# Patient Record
Sex: Female | Born: 1955 | Race: White | Hispanic: No | Marital: Single | Smoking: Former smoker
Health system: Southern US, Community
[De-identification: ages and names within clinical notes are randomized; demographics above are authoritative.]

## PROBLEM LIST (undated history)

## (undated) DIAGNOSIS — N6099 Unspecified benign mammary dysplasia of unspecified breast: Secondary | ICD-10-CM

## (undated) DIAGNOSIS — C7931 Secondary malignant neoplasm of brain: Secondary | ICD-10-CM

## (undated) DIAGNOSIS — R768 Other specified abnormal immunological findings in serum: Secondary | ICD-10-CM

## (undated) DIAGNOSIS — E7800 Pure hypercholesterolemia, unspecified: Secondary | ICD-10-CM

## (undated) DIAGNOSIS — C3490 Malignant neoplasm of unspecified part of unspecified bronchus or lung: Secondary | ICD-10-CM

## (undated) DIAGNOSIS — I10 Essential (primary) hypertension: Secondary | ICD-10-CM

## (undated) HISTORY — DX: Malignant neoplasm of unspecified part of unspecified bronchus or lung: C34.90

## (undated) HISTORY — DX: Secondary malignant neoplasm of brain: C79.31

## (undated) HISTORY — DX: Unspecified benign mammary dysplasia of unspecified breast: N60.99

## (undated) HISTORY — PX: ABDOMINAL HYSTERECTOMY: SHX81

## (undated) HISTORY — PX: BREAST SURGERY: SHX581

## (undated) HISTORY — DX: Other specified abnormal immunological findings in serum: R76.8

## (undated) HISTORY — DX: Essential (primary) hypertension: I10

## (undated) HISTORY — DX: Pure hypercholesterolemia, unspecified: E78.00

---
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
## 1996-01-20 HISTORY — PX: BUNIONECTOMY: SHX129

## 2001-01-19 HISTORY — PX: ANTERIOR CRUCIATE LIGAMENT REPAIR: SHX115

## 2002-01-19 HISTORY — PX: KNEE DEBRIDEMENT: SHX1894

## 2003-08-09 ENCOUNTER — Other Ambulatory Visit: Payer: Self-pay

## 2004-10-03 ENCOUNTER — Ambulatory Visit: Payer: Self-pay | Admitting: Internal Medicine

## 2004-10-29 ENCOUNTER — Other Ambulatory Visit: Payer: Self-pay

## 2004-11-03 ENCOUNTER — Ambulatory Visit: Payer: Self-pay | Admitting: General Surgery

## 2004-11-17 ENCOUNTER — Ambulatory Visit: Payer: Self-pay | Admitting: General Surgery

## 2005-07-08 ENCOUNTER — Ambulatory Visit: Payer: Self-pay | Admitting: General Surgery

## 2006-01-04 ENCOUNTER — Ambulatory Visit: Payer: Self-pay | Admitting: General Surgery

## 2006-01-21 ENCOUNTER — Ambulatory Visit: Payer: Self-pay | Admitting: General Surgery

## 2007-01-10 ENCOUNTER — Ambulatory Visit: Payer: Self-pay | Admitting: Internal Medicine

## 2007-01-12 ENCOUNTER — Ambulatory Visit: Payer: Self-pay | Admitting: Internal Medicine

## 2007-02-02 ENCOUNTER — Ambulatory Visit: Payer: Self-pay | Admitting: General Surgery

## 2007-02-09 ENCOUNTER — Other Ambulatory Visit: Payer: Self-pay

## 2007-02-09 ENCOUNTER — Ambulatory Visit: Payer: Self-pay | Admitting: General Surgery

## 2007-02-14 ENCOUNTER — Ambulatory Visit: Payer: Self-pay | Admitting: General Surgery

## 2007-02-20 ENCOUNTER — Ambulatory Visit: Payer: Self-pay | Admitting: Oncology

## 2007-02-21 ENCOUNTER — Ambulatory Visit: Payer: Self-pay | Admitting: Oncology

## 2007-03-20 ENCOUNTER — Ambulatory Visit: Payer: Self-pay | Admitting: Oncology

## 2007-04-20 ENCOUNTER — Ambulatory Visit: Payer: Self-pay | Admitting: Oncology

## 2007-06-20 ENCOUNTER — Ambulatory Visit: Payer: Self-pay | Admitting: Internal Medicine

## 2007-07-06 ENCOUNTER — Ambulatory Visit: Payer: Self-pay | Admitting: Oncology

## 2007-07-20 ENCOUNTER — Ambulatory Visit: Payer: Self-pay | Admitting: Internal Medicine

## 2007-07-20 ENCOUNTER — Ambulatory Visit: Payer: Self-pay | Admitting: Oncology

## 2007-09-20 ENCOUNTER — Ambulatory Visit: Payer: Self-pay | Admitting: Internal Medicine

## 2007-10-06 ENCOUNTER — Ambulatory Visit: Payer: Self-pay | Admitting: Internal Medicine

## 2007-10-20 ENCOUNTER — Ambulatory Visit: Payer: Self-pay | Admitting: Internal Medicine

## 2007-12-20 ENCOUNTER — Ambulatory Visit: Payer: Self-pay | Admitting: Internal Medicine

## 2008-01-04 ENCOUNTER — Ambulatory Visit: Payer: Self-pay | Admitting: Internal Medicine

## 2008-01-20 ENCOUNTER — Ambulatory Visit: Payer: Self-pay | Admitting: Internal Medicine

## 2008-06-19 ENCOUNTER — Ambulatory Visit: Payer: Self-pay | Admitting: Internal Medicine

## 2008-07-04 ENCOUNTER — Ambulatory Visit: Payer: Self-pay | Admitting: Internal Medicine

## 2008-07-19 ENCOUNTER — Ambulatory Visit: Payer: Self-pay | Admitting: Internal Medicine

## 2008-12-19 ENCOUNTER — Ambulatory Visit: Payer: Self-pay | Admitting: Internal Medicine

## 2008-12-21 ENCOUNTER — Ambulatory Visit: Payer: Self-pay | Admitting: General Practice

## 2009-01-02 ENCOUNTER — Ambulatory Visit: Payer: Self-pay | Admitting: Internal Medicine

## 2009-01-19 ENCOUNTER — Ambulatory Visit: Payer: Self-pay | Admitting: Internal Medicine

## 2009-03-28 ENCOUNTER — Ambulatory Visit: Payer: Self-pay | Admitting: Podiatry

## 2009-06-19 ENCOUNTER — Ambulatory Visit: Payer: Self-pay | Admitting: Internal Medicine

## 2009-07-17 ENCOUNTER — Ambulatory Visit: Payer: Self-pay | Admitting: Internal Medicine

## 2009-07-19 ENCOUNTER — Ambulatory Visit: Payer: Self-pay | Admitting: Internal Medicine

## 2010-01-16 ENCOUNTER — Ambulatory Visit: Payer: Self-pay | Admitting: Internal Medicine

## 2010-01-19 ENCOUNTER — Ambulatory Visit: Payer: Self-pay | Admitting: Internal Medicine

## 2010-04-15 ENCOUNTER — Ambulatory Visit: Payer: Self-pay | Admitting: Gastroenterology

## 2010-04-15 LAB — HM COLONOSCOPY

## 2010-07-22 ENCOUNTER — Ambulatory Visit: Payer: Self-pay | Admitting: Internal Medicine

## 2010-08-20 ENCOUNTER — Ambulatory Visit: Payer: Self-pay | Admitting: Internal Medicine

## 2011-01-22 ENCOUNTER — Ambulatory Visit: Payer: Self-pay | Admitting: Internal Medicine

## 2011-01-22 LAB — COMPREHENSIVE METABOLIC PANEL
Albumin: 3.6 g/dL (ref 3.4–5.0)
Alkaline Phosphatase: 102 U/L (ref 50–136)
Anion Gap: 7 (ref 7–16)
BUN: 9 mg/dL (ref 7–18)
Bilirubin,Total: 0.2 mg/dL (ref 0.2–1.0)
Calcium, Total: 8.9 mg/dL (ref 8.5–10.1)
Creatinine: 0.96 mg/dL (ref 0.60–1.30)
EGFR (African American): >60
Glucose: 110 mg/dL — ABNORMAL HIGH (ref 65–99)
Potassium: 3.4 mmol/L — ABNORMAL LOW (ref 3.5–5.1)
SGPT (ALT): 30 U/L
Sodium: 140 mmol/L (ref 136–145)
Total Protein: 7.1 g/dL (ref 6.4–8.2)

## 2011-01-22 LAB — CBC CANCER CENTER
Basophil #: 0 x10 3/mm (ref 0.0–0.1)
Eosinophil #: 0.1 x10 3/mm (ref 0.0–0.7)
HCT: 39.2 % (ref 35.0–47.0)
Lymphocyte #: 1.8 x10 3/mm (ref 1.0–3.6)
MCH: 30.1 pg (ref 26.0–34.0)
MCHC: 34.5 g/dL (ref 32.0–36.0)
MCV: 87 fL (ref 80–100)
Monocyte #: 0.5 x10 3/mm (ref 0.0–0.7)
Neutrophil #: 3.9 x10 3/mm (ref 1.4–6.5)
RBC: 4.5 10*6/uL (ref 3.80–5.20)
RDW: 12.7 % (ref 11.5–14.5)

## 2011-02-20 ENCOUNTER — Ambulatory Visit: Payer: Self-pay | Admitting: Internal Medicine

## 2011-08-10 ENCOUNTER — Ambulatory Visit: Payer: Self-pay | Admitting: Internal Medicine

## 2011-08-20 ENCOUNTER — Ambulatory Visit: Payer: Self-pay | Admitting: Internal Medicine

## 2011-11-25 ENCOUNTER — Encounter: Payer: Self-pay | Admitting: Internal Medicine

## 2011-11-25 ENCOUNTER — Ambulatory Visit (INDEPENDENT_AMBULATORY_CARE_PROVIDER_SITE_OTHER): Payer: No Typology Code available for payment source | Admitting: Internal Medicine

## 2011-11-25 VITALS — BP 145/85 | HR 85 | Temp 99.2°F | Resp 20 | Ht 68.0 in | Wt 238.0 lb

## 2011-11-25 DIAGNOSIS — N62 Hypertrophy of breast: Secondary | ICD-10-CM

## 2011-11-25 DIAGNOSIS — N6099 Unspecified benign mammary dysplasia of unspecified breast: Secondary | ICD-10-CM | POA: Insufficient documentation

## 2011-11-25 DIAGNOSIS — R5383 Other fatigue: Secondary | ICD-10-CM

## 2011-11-25 DIAGNOSIS — I10 Essential (primary) hypertension: Secondary | ICD-10-CM | POA: Insufficient documentation

## 2011-11-25 DIAGNOSIS — E7800 Pure hypercholesterolemia, unspecified: Secondary | ICD-10-CM

## 2011-11-25 MED ORDER — TRIAMTERENE-HCTZ 37.5-25 MG PO CAPS: 1.0000 | ORAL_CAPSULE | Freq: Every day | ORAL | Status: DC

## 2011-11-25 MED ORDER — LISINOPRIL 20 MG PO TABS: 20.0000 mg | ORAL_TABLET | Freq: Every day | ORAL | Status: DC

## 2011-11-25 NOTE — Patient Instructions (Addendum)
It was good seeing you today.  I am glad you have been doing well.  I am going to increase your lisinopril to 20mg  - one per day.  Monitor your blood pressures.  We will schedule your physical and follow up labs.

## 2011-11-25 NOTE — Assessment & Plan Note (Addendum)
Low cholesterol diet.  Not on cholesterol medication.  Check lipid panel with next fasting labs.

## 2011-11-25 NOTE — Progress Notes (Signed)
  Subjective:    Patient ID: Jennifer Delacruz, female    DOB: 11-28-1955, 56 y.o.   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  She states she is doing relatively well.  Blood pressure has been elevated.  Readings mostly ranging in the 140-150/70-88.  She denies any significant headache.  No dizziness.  No chest pain or tightness.  She saw Dr Linton Flemings.  Had her mammogram 6-7/13.  States everything checked out fine.   Past Medical History  Diagnosis Date  . Lobular hyperplasia, atypical, breast     saw Dr Linton Flemings - [facility] and Dr Sherrlyn Hock  . Hypertension   . Hypercholesterolemia   . Elevated antinuclear antibody (ANA) level     worked up by Dr Phylliss Bob    Outpatient Encounter Prescriptions as of 11/25/2011  Medication Sig Dispense Refill  . aspirin 81 MG tablet Take 81 mg by mouth daily.      . Calcium Carb-Cholecalciferol (CALCIUM + D3) 600-200 MG-UNIT TABS Take 600 mg by mouth 2 (two) times daily.      . cholecalciferol (VITAMIN D) 1000 UNITS tablet Take 1,000 Units by mouth daily.      . Multiple Vitamin (MULTIVITAMIN) tablet Take 1 tablet by mouth daily.      . potassium chloride SA (K-DUR,KLOR-CON) 20 MEQ tablet Take 20 mEq by mouth daily as needed.      . triamterene-hydrochlorothiazide (DYAZIDE) 37.5-25 MG per capsule Take 1 each (1 capsule total) by mouth daily.  30 capsule  12  . vitamin E 400 UNIT capsule 400 Units. Takes 2 daily      . [DISCONTINUED] triamterene-hydrochlorothiazide (DYAZIDE) 37.5-25 MG per capsule Take 1 capsule by mouth daily.      Marland Kitchen lisinopril (PRINIVIL,ZESTRIL) 20 MG tablet Take 1 tablet (20 mg total) by mouth daily.  30 tablet  4    Review of Systems Patient denies any headache, lightheadedness or dizziness.  No chest pain, tightness or palpitations.  No increased shortness of breath, cough or congestion.  No nausea or  vomiting.  No abdominal pain or cramping.  No bowel change, such as diarrhea, constipation, BRBPR or melana.  No urine change.        Objective:   Physical Exam Filed Vitals:   11/25/11 1103  BP: 145/85  Pulse: 85  Temp: 99.2 F (37.3 C)  Resp: 67   56 year old female in no acute distress.   HEENT:  Nares - clear.  OP- without lesions or erythema.  NECK:  Supple, nontender.  No audible bruit.   HEART:  Appears to be regular. LUNGS:  Without crackles or wheezing audible.  Respirations even and unlabored.   RADIAL PULSE:  Equal bilaterally.  ABDOMEN:  Soft, nontender.  No audible abdominal bruit.   EXTREMITIES:  No increased edema to be present.                     Assessment & Plan:  CARDIOVASCULAR.  Asymptomatic.  Continue risk factor modification.    INCREASED PSYCHOSOCIAL STRESSORS.  Doing well.  Follow.   HEALTH MAINTENANCE.  Physical 11/20/10.  Colonoscopy 04/15/10 revealed two polyps - otherwise normal.  Recommend follow up colonoscopy 3/17.  Bone density 11/01/08 - normal.  Got her flu shot.  States she had her mammogram 6/13.  Obtain results.

## 2011-12-12 ENCOUNTER — Encounter: Payer: Self-pay | Admitting: Internal Medicine

## 2011-12-12 NOTE — Assessment & Plan Note (Addendum)
Blood pressure elevated.  Will increase Lisinopril to 20mg  q day.  Continue triam/HCTZ.  Follow pressures.  Get her back in soon to reassess.  Check metabolic panel.

## 2011-12-12 NOTE — Assessment & Plan Note (Signed)
Had follow up with Dr Jodi Mourning this summer.  Had her mammogram 6-7/13.  Obtain results.  States everything checked out fine.

## 2011-12-18 ENCOUNTER — Other Ambulatory Visit (INDEPENDENT_AMBULATORY_CARE_PROVIDER_SITE_OTHER): Payer: No Typology Code available for payment source

## 2011-12-18 DIAGNOSIS — E7800 Pure hypercholesterolemia, unspecified: Secondary | ICD-10-CM

## 2011-12-18 DIAGNOSIS — R5383 Other fatigue: Secondary | ICD-10-CM

## 2011-12-18 LAB — COMPREHENSIVE METABOLIC PANEL
ALT: 23 U/L (ref 0–35)
AST: 23 U/L (ref 0–37)
Albumin: 3.4 g/dL — ABNORMAL LOW (ref 3.5–5.2)
Alkaline Phosphatase: 76 U/L (ref 39–117)
BUN: 10 mg/dL (ref 6–23)
Calcium: 9.5 mg/dL (ref 8.4–10.5)
Chloride: 103 mEq/L (ref 96–112)
Potassium: 3.8 mEq/L (ref 3.5–5.1)
Sodium: 141 mEq/L (ref 135–145)
Total Protein: 6.8 g/dL (ref 6.0–8.3)

## 2011-12-18 LAB — LIPID PANEL
Cholesterol: 280 mg/dL — ABNORMAL HIGH (ref 0–200)
Triglycerides: 97 mg/dL (ref 0.0–149.0)

## 2011-12-18 LAB — CBC WITH DIFFERENTIAL/PLATELET
Basophils Absolute: 0.1 10*3/uL (ref 0.0–0.1)
Eosinophils Absolute: 0.1 10*3/uL (ref 0.0–0.7)
Lymphocytes Relative: 24.1 % (ref 12.0–46.0)
MCHC: 32.9 g/dL (ref 30.0–36.0)
MCV: 88.4 fl (ref 78.0–100.0)
Monocytes Absolute: 0.6 10*3/uL (ref 0.1–1.0)
Neutrophils Relative %: 64.2 % (ref 43.0–77.0)
Platelets: 260 10*3/uL (ref 150.0–400.0)
RDW: 12.9 % (ref 11.5–14.6)

## 2011-12-22 ENCOUNTER — Telehealth: Payer: Self-pay | Admitting: Internal Medicine

## 2011-12-22 NOTE — Telephone Encounter (Signed)
Pt notified of labs vial messaging.  Will discuss with her more at her 12/24/11 appt.

## 2011-12-24 ENCOUNTER — Ambulatory Visit (INDEPENDENT_AMBULATORY_CARE_PROVIDER_SITE_OTHER): Payer: No Typology Code available for payment source | Admitting: Internal Medicine

## 2011-12-24 ENCOUNTER — Encounter: Payer: Self-pay | Admitting: Internal Medicine

## 2011-12-24 ENCOUNTER — Other Ambulatory Visit (HOSPITAL_COMMUNITY)
Admission: RE | Admit: 2011-12-24 | Discharge: 2011-12-24 | Disposition: A | Discharge status: Home / Self Care | Payer: No Typology Code available for payment source | Source: Ambulatory Visit | Attending: Internal Medicine | Admitting: Internal Medicine

## 2011-12-24 ENCOUNTER — Telehealth: Payer: Self-pay | Admitting: *Deleted

## 2011-12-24 VITALS — BP 149/64 | HR 82 | Temp 98.5°F | Ht 68.0 in | Wt 240.5 lb

## 2011-12-24 DIAGNOSIS — N6099 Unspecified benign mammary dysplasia of unspecified breast: Secondary | ICD-10-CM

## 2011-12-24 DIAGNOSIS — E7800 Pure hypercholesterolemia, unspecified: Secondary | ICD-10-CM

## 2011-12-24 DIAGNOSIS — I10 Essential (primary) hypertension: Secondary | ICD-10-CM

## 2011-12-24 DIAGNOSIS — Z1151 Encounter for screening for human papillomavirus (HPV): Secondary | ICD-10-CM | POA: Insufficient documentation

## 2011-12-24 DIAGNOSIS — Z139 Encounter for screening, unspecified: Secondary | ICD-10-CM

## 2011-12-24 DIAGNOSIS — Z01419 Encounter for gynecological examination (general) (routine) without abnormal findings: Secondary | ICD-10-CM | POA: Insufficient documentation

## 2011-12-24 DIAGNOSIS — N62 Hypertrophy of breast: Secondary | ICD-10-CM

## 2011-12-24 MED ORDER — ATORVASTATIN CALCIUM 10 MG PO TABS: 10.0000 mg | ORAL_TABLET | Freq: Every day | ORAL | Status: DC

## 2011-12-24 MED ORDER — AZITHROMYCIN 250 MG PO TABS: ORAL_TABLET | ORAL | Status: DC

## 2011-12-24 MED ORDER — ALBUTEROL SULFATE HFA 108 (90 BASE) MCG/ACT IN AERS: 2.0000 | INHALATION_SPRAY | Freq: Four times a day (QID) | RESPIRATORY_TRACT | Status: DC | PRN

## 2011-12-24 MED ORDER — LISINOPRIL 30 MG PO TABS: 30.0000 mg | ORAL_TABLET | Freq: Every day | ORAL | Status: DC

## 2011-12-24 NOTE — Patient Instructions (Addendum)
It was nice seeing you today.  I am sorry you are not feelling well.  I am going to prescribe a zpak - take as directed.  Also albuterol inhaler - can use 2 puffs - 4x/day as needed.  Let me know if you have any problems.  I am also going to increase the Lisinopril to 30mg  per day.  Follow pressures.

## 2011-12-24 NOTE — Telephone Encounter (Signed)
Patient called in concern about her lips swelling. Patient stated that swelling began when she left the office this morning. Dr. Lorin Picket advised patient to go to er, take benadryl, and stop taking lisinopril.  Patient stated that she had taken 2 benadryl tabs and swelling was going down. Patient stated that she would probably would not go to ER. Advised patient to keep [place] posted.

## 2011-12-24 NOTE — Assessment & Plan Note (Addendum)
Lipid profile just checked and elevated.  Low cholesterol diet and exercise.  Has had problems with simvastatin and pravastatin in the past.  Will start Lipitor 10mg  q mon, wed and Friday at first and then increased to daily.  Check liver panel in 6 weeks after starting.

## 2011-12-24 NOTE — Progress Notes (Signed)
Subjective:    Patient ID: Jennifer Delacruz, female    DOB: 03-13-1955, 56 y.o.   MRN: [id number]  HPI 56 year old female with past history of hypertension, hypercholesterolemia and atypical lobular hyperplasia of the right breast who comes in today to follow up on these issues as well as for a complete physical exam.  Over the last few days, she has developed sore throat, chest congestion and cough (mostly nonproductive).  Raw feeling in her chest.  Some intermittent wheezing.  No fever or sinus congestion.  No nasal congestion or earache.  No nausea or vomiting.    Past Medical History  Diagnosis Date  . Lobular hyperplasia, atypical, breast     saw Dr Linton Flemings - [facility] and Dr Sherrlyn Hock  . Hypertension   . Hypercholesterolemia   . Elevated antinuclear antibody (ANA) level     worked up by Dr Phylliss Bob    Current Outpatient Prescriptions on File Prior to Visit  Medication Sig Dispense Refill  . aspirin 81 MG tablet Take 81 mg by mouth daily.      . Calcium Carb-Cholecalciferol (CALCIUM + D3) 600-200 MG-UNIT TABS Take 600 mg by mouth 2 (two) times daily.      . cholecalciferol (VITAMIN D) 1000 UNITS tablet Take 1,000 Units by mouth daily.      Marland Kitchen lisinopril (PRINIVIL,ZESTRIL) 20 MG tablet Take 1 tablet (20 mg total) by mouth daily.  30 tablet  4  . Multiple Vitamin (MULTIVITAMIN) tablet Take 1 tablet by mouth daily.      . potassium chloride SA (K-DUR,KLOR-CON) 20 MEQ tablet Take 20 mEq by mouth daily as needed.      . triamterene-hydrochlorothiazide (DYAZIDE) 37.5-25 MG per capsule Take 1 each (1 capsule total) by mouth daily.  30 capsule  12  . vitamin E 400 UNIT capsule 400 Units. Takes 2 daily        Review of Systems Patient denies any headache, lightheadedness or dizziness.  No significant sinus congestion.  Does report the sore throat. No chest pain, tightness or palpitations with increased activity or exertion.  No increased shortness of breath, but she does report the wheezing and cough and  congestion.  Soreness from increased cough.   No nausea or vomiting.  No abdominal pain or cramping.  No bowel change, such as diarrhea, constipation, BRBPR or melana.  No urine change.  States there has been no change in her diet.  Cholesterol elevated.  Blood pressures averaging - 130-160/78-90.      Objective:   Physical Exam Filed Vitals:   12/24/11 0852  BP: 149/64  Pulse: 82  Temp: 98.5 F (36.9 C)   Blood pressure recheck:  89/6  56 year old female in no acute distress.   HEENT:  Nares- clear.  Oropharynx - without lesions. NECK:  Supple.  Nontender.  No audible bruit.  HEART:  Appears to be regular. LUNGS:  No crackles or wheezing audible.  Respirations even and unlabored.  RADIAL PULSE:  Equal bilaterally.    BREASTS:  No nipple discharge or nipple retraction present.  Could not appreciate any distinct nodules or axillary adenopathy.  ABDOMEN:  Soft, nontender.  Bowel sounds present and normal.  No audible abdominal bruit.  GU:  Normal external genitalia.  Vaginal vault without lesions.  Cervix identified.  Pap performed. Could not appreciate any adnexal masses or tenderness.   RECTAL:  Heme negative.   EXTREMITIES:  No increased edema present.  DP pulses palpable and equal bilaterally.  Assessment & Plan:  CARDIOVASCULAR.  Currently asymptomatic.  Continue risk factor modification.    INCREASED PSYCHOSOCIAL STRESSORS.  Doing well.  Follow.   URI.  Treat with Zpak as directed.  Albuterol inhaler as directed.  Robitussin prn.  Let me know if symptoms do not resolve.    HEALTH MAINTENANCE.  Physical today.  Sees Dr Linton Flemings at [facility].  States mammo - ok.  Colonoscopy 04/15/10 revealed two polyps otherwise normal.  Recommended follow up colonoscopy 3/17.  Bone density 11/01/08 - normal.

## 2011-12-25 ENCOUNTER — Telehealth: Payer: Self-pay | Admitting: Internal Medicine

## 2011-12-25 ENCOUNTER — Encounter: Payer: Self-pay | Admitting: Internal Medicine

## 2011-12-25 MED ORDER — AMLODIPINE BESYLATE 5 MG PO TABS: 5.0000 mg | ORAL_TABLET | Freq: Every day | ORAL | Status: DC

## 2011-12-25 NOTE — Telephone Encounter (Signed)
Called pt and spoke to her regarding her my chart message.  She will stay off lisinopril. Will start norvasc 5mg  q day.  Follow pressures.  Notify me if problems.  rx sent to [facility]

## 2011-12-27 ENCOUNTER — Encounter: Payer: Self-pay | Admitting: Internal Medicine

## 2011-12-27 NOTE — Assessment & Plan Note (Signed)
Followed by Dr Linton Flemings.  They are following mammograms.  States up to date and last mammo ok.  Obtain results.

## 2011-12-27 NOTE — Assessment & Plan Note (Signed)
Blood pressure still elevated.  Will increased Lisinopril to 30mg  q day.  Follow pressures.  Get her back in soon to reassess.

## 2011-12-30 ENCOUNTER — Telehealth: Payer: Self-pay | Admitting: Internal Medicine

## 2011-12-30 NOTE — Telephone Encounter (Signed)
Pt notified of pap results via my chart message

## 2012-01-14 ENCOUNTER — Encounter: Payer: Self-pay | Admitting: Internal Medicine

## 2012-01-22 ENCOUNTER — Ambulatory Visit: Payer: Self-pay | Admitting: Internal Medicine

## 2012-02-08 ENCOUNTER — Telehealth: Payer: Self-pay | Admitting: Internal Medicine

## 2012-02-08 ENCOUNTER — Ambulatory Visit (INDEPENDENT_AMBULATORY_CARE_PROVIDER_SITE_OTHER): Payer: No Typology Code available for payment source | Admitting: Internal Medicine

## 2012-02-08 ENCOUNTER — Encounter: Payer: Self-pay | Admitting: Internal Medicine

## 2012-02-08 VITALS — BP 130/70 | HR 82 | Ht 68.0 in | Wt 236.2 lb

## 2012-02-08 DIAGNOSIS — E7800 Pure hypercholesterolemia, unspecified: Secondary | ICD-10-CM

## 2012-02-08 DIAGNOSIS — N62 Hypertrophy of breast: Secondary | ICD-10-CM

## 2012-02-08 DIAGNOSIS — N6099 Unspecified benign mammary dysplasia of unspecified breast: Secondary | ICD-10-CM

## 2012-02-08 DIAGNOSIS — I10 Essential (primary) hypertension: Secondary | ICD-10-CM

## 2012-02-08 LAB — HEPATIC FUNCTION PANEL
ALT: 33 U/L (ref 0–35)
AST: 35 U/L (ref 0–37)
Alkaline Phosphatase: 79 U/L (ref 39–117)
Bilirubin, Direct: 0 mg/dL (ref 0.0–0.3)
Total Bilirubin: 0.4 mg/dL (ref 0.3–1.2)
Total Protein: 7.6 g/dL (ref 6.0–8.3)

## 2012-02-08 NOTE — Telephone Encounter (Signed)
Notified of lab results via my chart.  

## 2012-02-09 ENCOUNTER — Encounter: Payer: Self-pay | Admitting: Internal Medicine

## 2012-02-09 NOTE — Assessment & Plan Note (Signed)
On lipitor now.  Tolerating.  Adjusting diet.  Check liver panel.

## 2012-02-09 NOTE — Assessment & Plan Note (Signed)
Blood pressure doing better.  Off lisinopril - concern regarding angioedema.  Doing well on amlodipine.  Follow.

## 2012-02-09 NOTE — Assessment & Plan Note (Signed)
Followed by Dr Labriola.  Up to date with mammograms.  Follow.    

## 2012-02-09 NOTE — Progress Notes (Signed)
Subjective:    Patient ID: Jennifer Delacruz, female    DOB: March 11, 1955, 57 y.o.   MRN: [id number]  HPI 57 year old female with past history of hypertension, hypercholesterolemia and atypical lobular hyperplasia of the right breast who comes in today for a scheduled follow up.  She states she is doing well.  No cardiac symptoms with increased activity or exertion.  Breathing stable.  No increased cough or congestion.  No acid reflux.  Bowels stable.  Exercising.  Overall she feels she is doing well.    Past Medical History  Diagnosis Date  . Lobular hyperplasia, atypical, breast     saw Dr Linton Flemings - [facility] and Dr Sherrlyn Hock  . Hypertension   . Hypercholesterolemia   . Elevated antinuclear antibody (ANA) level     worked up by Dr Phylliss Bob    Current Outpatient Prescriptions on File Prior to Visit  Medication Sig Dispense Refill  . albuterol (PROVENTIL HFA;VENTOLIN HFA) 108 (90 BASE) MCG/ACT inhaler Inhale 2 puffs into the lungs every 6 (six) hours as needed for wheezing.  1 Inhaler  0  . amLODipine (NORVASC) 5 MG tablet Take 1 tablet (5 mg total) by mouth daily.  30 tablet  3  . aspirin 81 MG tablet Take 81 mg by mouth daily.      Marland Kitchen atorvastatin (LIPITOR) 10 MG tablet Take 1 tablet (10 mg total) by mouth daily.  30 tablet  3  . azithromycin (ZITHROMAX) 250 MG tablet Take 2 tablets x 1 day and then one tablet per day for the next 4 days.  6 tablet  0  . Calcium Carb-Cholecalciferol (CALCIUM + D3) 600-200 MG-UNIT TABS Take 600 mg by mouth 2 (two) times daily.      . cholecalciferol (VITAMIN D) 1000 UNITS tablet Take 1,000 Units by mouth daily.      . Multiple Vitamin (MULTIVITAMIN) tablet Take 1 tablet by mouth daily.      . potassium chloride SA (K-DUR,KLOR-CON) 20 MEQ tablet Take 20 mEq by mouth daily as needed.      . triamterene-hydrochlorothiazide (DYAZIDE) 37.5-25 MG per capsule Take 1 each (1 capsule total) by mouth daily.  30 capsule  12  . vitamin E 400 UNIT capsule 400 Units. Takes 2 daily          Review of Systems Patient denies any headache, lightheadedness or dizziness.  No sinus congestion.  No chest pain, tightness or palpitations with increased activity or exertion.  No increased shortness of breath.  No cough or congestion.  No nausea or vomiting.  No abdominal pain or cramping.  No bowel change, such as diarrhea, constipation, BRBPR or melana.  No urine change.  Cholesterol was elevated.  Started on Lipitor.  Tolerating.   Blood pressures averaging - 130-140/70s.  Overall she feels she is doing well.       Objective:   Physical Exam  Filed Vitals:   02/08/12 1136  BP: 130/70  Pulse: 82   Blood pressure recheck:  130/78, pulse 70  57 year old female in no acute distress.   HEENT:  Nares- clear.  Oropharynx - without lesions. NECK:  Supple.  Nontender.  No audible bruit.  HEART:  Appears to be regular. LUNGS:  No crackles or wheezing audible.  Respirations even and unlabored.  RADIAL PULSE:  Equal bilaterally. ABDOMEN:  Soft, nontender.  Bowel sounds present and normal.  No audible abdominal bruit.   EXTREMITIES:  No increased edema present.  DP pulses palpable and equal  bilaterally.           Assessment & Plan:  CARDIOVASCULAR.  Currently asymptomatic.  Continue risk factor modification.    INCREASED PSYCHOSOCIAL STRESSORS.  Doing well.  Follow.    HEALTH MAINTENANCE.  Physical 12/24/11.  Sees Dr Linton Flemings at [facility].  States mammo - ok.  Colonoscopy 04/15/10 revealed two polyps otherwise normal.  Recommended follow up colonoscopy 3/17.  Bone density 11/01/08 - normal.

## 2012-03-22 ENCOUNTER — Encounter: Payer: Self-pay | Admitting: Internal Medicine

## 2012-03-25 ENCOUNTER — Ambulatory Visit: Payer: No Typology Code available for payment source | Admitting: Internal Medicine

## 2012-03-29 ENCOUNTER — Ambulatory Visit (INDEPENDENT_AMBULATORY_CARE_PROVIDER_SITE_OTHER): Payer: No Typology Code available for payment source | Admitting: Internal Medicine

## 2012-03-29 ENCOUNTER — Encounter: Payer: Self-pay | Admitting: Internal Medicine

## 2012-03-29 VITALS — BP 120/80 | HR 77 | Temp 97.6°F | Ht 68.0 in | Wt 233.5 lb

## 2012-03-29 DIAGNOSIS — I10 Essential (primary) hypertension: Secondary | ICD-10-CM

## 2012-03-29 DIAGNOSIS — K148 Other diseases of tongue: Secondary | ICD-10-CM

## 2012-03-31 ENCOUNTER — Encounter: Payer: Self-pay | Admitting: Internal Medicine

## 2012-04-01 ENCOUNTER — Telehealth: Payer: Self-pay | Admitting: Internal Medicine

## 2012-04-01 NOTE — Telephone Encounter (Signed)
My chart response

## 2012-04-04 ENCOUNTER — Encounter: Payer: Self-pay | Admitting: Internal Medicine

## 2012-04-04 NOTE — Progress Notes (Signed)
Subjective:    Patient ID: Jennifer Delacruz, female    DOB: 1955/11/20, 57 y.o.   MRN: [id number]  HPI 57 year old female with past history of hypertension, hypercholesterolemia and atypical lobular hyperplasia of the right breast who comes in today as a work in with concerns regarding a brown/white coating on her tongue.  States she was evaluated on 02/25/12 at an urgent care.  Was given Dukes mouthwash.  No change.  Saw her dentist.  They were unsure of diagnosis.  No pain on her tongue.  She states she has noticed some minimal sore throat - back of her throat.  She is eating and drinking normally.  Some drainage.  No fever or chills.  No odynophagia.  No new medications.  no recent abx.  She is drinking herbal life now.      Past Medical History  Diagnosis Date  . Lobular hyperplasia, atypical, breast     saw Dr Linton Flemings - [facility] and Dr Sherrlyn Hock  . Hypertension   . Hypercholesterolemia   . Elevated antinuclear antibody (ANA) level     worked up by Dr Phylliss Bob    Current Outpatient Prescriptions on File Prior to Visit  Medication Sig Dispense Refill  . aspirin 81 MG tablet Take 81 mg by mouth daily.      Marland Kitchen atorvastatin (LIPITOR) 10 MG tablet Take 1 tablet (10 mg total) by mouth daily.  30 tablet  3  . Calcium Carb-Cholecalciferol (CALCIUM + D3) 600-200 MG-UNIT TABS Take 600 mg by mouth 2 (two) times daily.      . cholecalciferol (VITAMIN D) 1000 UNITS tablet Take 1,000 Units by mouth daily.      . Multiple Vitamin (MULTIVITAMIN) tablet Take 1 tablet by mouth daily.      Marland Kitchen triamterene-hydrochlorothiazide (DYAZIDE) 37.5-25 MG per capsule Take 1 each (1 capsule total) by mouth daily.  30 capsule  12  . vitamin E 400 UNIT capsule 400 Units. Takes 2 daily      . albuterol (PROVENTIL HFA;VENTOLIN HFA) 108 (90 BASE) MCG/ACT inhaler Inhale 2 puffs into the lungs every 6 (six) hours as needed for wheezing.  1 Inhaler  0  . amLODipine (NORVASC) 5 MG tablet Take 1 tablet (5 mg total) by mouth daily.  30 tablet   3  . azithromycin (ZITHROMAX) 250 MG tablet Take 2 tablets x 1 day and then one tablet per day for the next 4 days.  6 tablet  0  . potassium chloride SA (K-DUR,KLOR-CON) 20 MEQ tablet Take 20 mEq by mouth daily as needed.       No current facility-administered medications on file prior to visit.    Review of Systems Patient denies any headache, lightheadedness or dizziness.  No sinus congestion.  Minimal drainage.  Some soreness in the back of her throat.  No chest pain, tightness or palpitations.  No increased shortness of breath.  No cough or congestion.  No nausea or vomiting.  No acid reflux.  No abdominal pain or cramping.  No rash.  No fever or chills.      Objective:   Physical Exam  Filed Vitals:   03/29/12 0851  BP: 120/80  Pulse: 77  Temp: 97.6 F (86.61 C)   57 year old female in no acute distress.   HEENT:  Nares- clear.  Oropharynx - thick brown/white coating over the tongue.  No posterior oropharynx - lesions or erythema.   NECK:  Supple.  Nontender. HEART:  Appears to be regular. LUNGS:  No crackles or wheezing audible.  Respirations even and unlabored.  RADIAL PULSE:  Equal bilaterally.     Assessment & Plan:  ENT.  Coating on her tongue as outlined.  Question of thrush.  Did not improve with Dukes.  Will give her mycelex trouches.  Refer to ENT for evluation and further treatment.  Stop the herbal life.   INCREASED PSYCHOSOCIAL STRESSORS.  Doing well.  Follow.    HEALTH MAINTENANCE.  Physical 12/24/11.  Sees Dr Linton Flemings at [facility].  States mammo - ok.  Colonoscopy 04/15/10 revealed two polyps otherwise normal.  Recommended follow up colonoscopy 3/17.  Bone density 11/01/08 - normal.

## 2012-04-04 NOTE — Assessment & Plan Note (Signed)
Blood pressure doing better.  Off lisinopril - concern regarding angioedema.  Doing well on amlodipine.  Follow.

## 2012-04-07 ENCOUNTER — Encounter: Payer: Self-pay | Admitting: Internal Medicine

## 2012-04-21 ENCOUNTER — Other Ambulatory Visit: Payer: Self-pay | Admitting: Internal Medicine

## 2012-04-21 NOTE — Telephone Encounter (Signed)
Med filled.  

## 2012-04-22 ENCOUNTER — Encounter: Payer: Self-pay | Admitting: Internal Medicine

## 2012-04-23 ENCOUNTER — Telehealth: Payer: Self-pay | Admitting: Internal Medicine

## 2012-04-23 NOTE — Telephone Encounter (Signed)
Mychart message sent.

## 2012-04-24 ENCOUNTER — Telehealth: Payer: Self-pay | Admitting: Internal Medicine

## 2012-04-24 ENCOUNTER — Encounter: Payer: Self-pay | Admitting: Internal Medicine

## 2012-04-24 NOTE — Telephone Encounter (Signed)
Sent pt mychart message regarding labs

## 2012-04-24 NOTE — Telephone Encounter (Signed)
Already addressed.  Sent pt a message regarding blood work.

## 2012-04-26 ENCOUNTER — Telehealth: Payer: Self-pay | Admitting: Internal Medicine

## 2012-04-26 DIAGNOSIS — B370 Candidal stomatitis: Secondary | ICD-10-CM

## 2012-04-26 NOTE — Telephone Encounter (Signed)
I placed order for labs.  Please call pt and schedule a non fasting lab appt within the next week.

## 2012-04-27 NOTE — Telephone Encounter (Signed)
Patient scheduled for non fasting labs

## 2012-04-27 NOTE — Telephone Encounter (Signed)
Left message for pt to call office.  Please schedule lab appointment

## 2012-05-02 ENCOUNTER — Other Ambulatory Visit (INDEPENDENT_AMBULATORY_CARE_PROVIDER_SITE_OTHER): Payer: No Typology Code available for payment source

## 2012-05-02 DIAGNOSIS — B370 Candidal stomatitis: Secondary | ICD-10-CM

## 2012-05-02 LAB — CBC WITH DIFFERENTIAL/PLATELET
Basophils Relative: 0.5 % (ref 0.0–3.0)
Eosinophils Relative: 1.8 % (ref 0.0–5.0)
HCT: 39.3 % (ref 36.0–46.0)
Hemoglobin: 13.2 g/dL (ref 12.0–15.0)
Lymphocytes Relative: 23.4 % (ref 12.0–46.0)
Lymphs Abs: 1.5 10*3/uL (ref 0.7–4.0)
Monocytes Relative: 8.9 % (ref 3.0–12.0)
Neutro Abs: 4.2 10*3/uL (ref 1.4–7.7)
RBC: 4.55 Mil/uL (ref 3.87–5.11)

## 2012-05-02 LAB — GLUCOSE, RANDOM: Glucose, Bld: 111 mg/dL — ABNORMAL HIGH (ref 70–99)

## 2012-05-03 ENCOUNTER — Telehealth: Payer: Self-pay | Admitting: Internal Medicine

## 2012-05-03 LAB — ANA: Anti Nuclear Antibody(ANA): NEGATIVE

## 2012-05-03 NOTE — Telephone Encounter (Signed)
Notified of labs via my chart 

## 2012-05-04 ENCOUNTER — Other Ambulatory Visit: Payer: Self-pay | Admitting: Internal Medicine

## 2012-05-04 ENCOUNTER — Telehealth: Payer: Self-pay | Admitting: Internal Medicine

## 2012-05-04 NOTE — Telephone Encounter (Signed)
Notified of lab results via my chart.  

## 2012-05-08 ENCOUNTER — Encounter: Payer: Self-pay | Admitting: Internal Medicine

## 2012-05-08 DIAGNOSIS — B370 Candidal stomatitis: Secondary | ICD-10-CM

## 2012-05-09 NOTE — Telephone Encounter (Signed)
Order placed for referral to Dr Blocker for evaluation of question of persistent oral thrush.  Has seen ENT and they recommended referral to Infectious disease.

## 2012-06-13 ENCOUNTER — Ambulatory Visit: Payer: No Typology Code available for payment source | Admitting: Internal Medicine

## 2012-06-15 ENCOUNTER — Ambulatory Visit: Payer: No Typology Code available for payment source | Admitting: Internal Medicine

## 2012-06-16 ENCOUNTER — Ambulatory Visit (INDEPENDENT_AMBULATORY_CARE_PROVIDER_SITE_OTHER): Payer: No Typology Code available for payment source | Admitting: Internal Medicine

## 2012-06-16 ENCOUNTER — Encounter: Payer: Self-pay | Admitting: Internal Medicine

## 2012-06-16 VITALS — BP 130/80 | HR 79 | Temp 98.6°F | Ht 68.0 in | Wt 240.2 lb

## 2012-06-16 DIAGNOSIS — I10 Essential (primary) hypertension: Secondary | ICD-10-CM

## 2012-06-16 DIAGNOSIS — E7800 Pure hypercholesterolemia, unspecified: Secondary | ICD-10-CM

## 2012-06-16 DIAGNOSIS — N6099 Unspecified benign mammary dysplasia of unspecified breast: Secondary | ICD-10-CM

## 2012-06-16 DIAGNOSIS — N62 Hypertrophy of breast: Secondary | ICD-10-CM

## 2012-06-16 NOTE — Progress Notes (Signed)
Subjective:    Patient ID: Jennifer Delacruz, female    DOB: 04-26-1955, 57 y.o.   MRN: [id number]  HPI 57 year old female with past history of hypertension, hypercholesterolemia and atypical lobular hyperplasia of the right breast who comes in today for a scheduled follow up.  She has been having issues with her tongue.  Has a thick white/yellow coating.  Has tried multiple medications and has seen ENT and Dr Leavy Cella.  Has not resolved.  No pain.  Swallowing fine.  Otherwise doing well.  No chest pain or tightness.  No acid reflux.     Past Medical History  Diagnosis Date  . Lobular hyperplasia, atypical, breast     saw Dr Linton Flemings - [facility] and Dr Sherrlyn Hock  . Hypertension   . Hypercholesterolemia   . Elevated antinuclear antibody (ANA) level     worked up by Dr Phylliss Bob    Outpatient Encounter Prescriptions as of 06/16/2012  Medication Sig Dispense Refill  . amLODipine (NORVASC) 5 MG tablet TAKE ONE (1) TABLET EACH DAY  30 tablet  6  . aspirin 81 MG tablet Take 81 mg by mouth daily.      Marland Kitchen atorvastatin (LIPITOR) 10 MG tablet TAKE ONE (1) TABLET EACH DAY  30 tablet  5  . Calcium Carb-Cholecalciferol (CALCIUM + D3) 600-200 MG-UNIT TABS Take 600 mg by mouth 2 (two) times daily.      . cholecalciferol (VITAMIN D) 1000 UNITS tablet Take 1,000 Units by mouth daily.      . Multiple Vitamin (MULTIVITAMIN) tablet Take 1 tablet by mouth daily.      Marland Kitchen triamterene-hydrochlorothiazide (DYAZIDE) 37.5-25 MG per capsule Take 1 each (1 capsule total) by mouth daily.  30 capsule  12  . vitamin E 400 UNIT capsule 400 Units. Takes 2 daily      . albuterol (PROVENTIL HFA;VENTOLIN HFA) 108 (90 BASE) MCG/ACT inhaler Inhale 2 puffs into the lungs every 6 (six) hours as needed for wheezing.  1 Inhaler  0  . potassium chloride SA (K-DUR,KLOR-CON) 20 MEQ tablet Take 20 mEq by mouth daily as needed.      . [DISCONTINUED] azithromycin (ZITHROMAX) 250 MG tablet Take 2 tablets x 1 day and then one tablet per day for the next 4  days.  6 tablet  0   No facility-administered encounter medications on file as of 06/16/2012.    Review of Systems Patient denies any headache, lightheadedness or dizziness.  No sinus or allergy symptoms.  No chest pain, tightness or palpitations.  No increased shortness of breath, cough or congestion.  No nausea or vomiting.  No acid reflux.  No abdominal pain or cramping.  No bowel change, such as diarrhea, constipation, BRBPR or melana.  No urine change.  Trying to stay active.  Discussed watching diet.  Persistent changes with her tongue.       Objective:   Physical Exam Filed Vitals:   06/16/12 0805  BP: 130/80  Pulse: 79  Temp: 98.6 F (87 C)   57 year old female in no acute distress.   HEENT:  Nares- clear.  Oropharynx - thick white/yellow coating on tongue.  No other lesions.  NECK:  Supple.  Nontender.  No audible bruit.  HEART:  Appears to be regular. LUNGS:  No crackles or wheezing audible.  Respirations even and unlabored.  RADIAL PULSE:  Equal bilaterally.   ABDOMEN:  Soft, nontender.  Bowel sounds present and normal.  No audible abdominal bruit.    EXTREMITIES:  No increased edema present.  DP pulses palpable and equal bilaterally.      Assessment & Plan:  ENT.  Tongue changes and exam as outlined.  Persistent.  Has tried mycelex trouches and Dukes.  Has seen ENT and Dr Leavy Cella.  Desires no further w/up now.  Will follow.    CARDIOVASCULAR.  Asymptomatic.  Continue risk factor modification.    INCREASED PSYCHOSOCIAL STRESSORS.  Doing well.  Follow.   HEALTH MAINTENANCE.  Physical 12/24/11.  Sees Dr Linton Flemings at [facility] for her mammograms.  States last mammo ok.  Due to f/u this summer.  Colonoscopy 04/15/10 revealed two polyps otherwise normal.  Recommended follow up colonoscopy 3/17.  Bone density 11/01/08 - normal.

## 2012-06-19 ENCOUNTER — Encounter: Payer: Self-pay | Admitting: Internal Medicine

## 2012-06-19 NOTE — Assessment & Plan Note (Signed)
On lipitor now.  Tolerating.  Adjusting diet.  Check lipid profile and liver panel.

## 2012-06-19 NOTE — Assessment & Plan Note (Signed)
Blood pressure doing better.  Off lisinopril - concern regarding angioedema.  Doing well on amlodipine.  Follow.  Check metabolic panel.

## 2012-06-19 NOTE — Assessment & Plan Note (Signed)
Followed by Dr Labriola.  Up to date with mammograms.  Follow.    

## 2012-07-01 ENCOUNTER — Other Ambulatory Visit: Payer: No Typology Code available for payment source

## 2012-07-07 ENCOUNTER — Encounter: Payer: Self-pay | Admitting: Internal Medicine

## 2012-07-14 ENCOUNTER — Other Ambulatory Visit (INDEPENDENT_AMBULATORY_CARE_PROVIDER_SITE_OTHER): Payer: No Typology Code available for payment source

## 2012-07-14 DIAGNOSIS — E7800 Pure hypercholesterolemia, unspecified: Secondary | ICD-10-CM

## 2012-07-14 DIAGNOSIS — I10 Essential (primary) hypertension: Secondary | ICD-10-CM

## 2012-07-14 LAB — BASIC METABOLIC PANEL
BUN: 12 mg/dL (ref 6–23)
Calcium: 9.7 mg/dL (ref 8.4–10.5)
Chloride: 101 mEq/L (ref 96–112)
Creatinine, Ser: 0.7 mg/dL (ref 0.4–1.2)

## 2012-07-14 LAB — HEPATIC FUNCTION PANEL
AST: 21 U/L (ref 0–37)
Albumin: 3.6 g/dL (ref 3.5–5.2)
Alkaline Phosphatase: 76 U/L (ref 39–117)
Total Bilirubin: 0.4 mg/dL (ref 0.3–1.2)

## 2012-07-14 LAB — LIPID PANEL
Cholesterol: 204 mg/dL — ABNORMAL HIGH (ref 0–200)
Total CHOL/HDL Ratio: 3

## 2012-07-18 ENCOUNTER — Encounter: Payer: Self-pay | Admitting: Internal Medicine

## 2012-08-21 ENCOUNTER — Encounter: Payer: Self-pay | Admitting: Internal Medicine

## 2012-09-24 ENCOUNTER — Encounter: Payer: Self-pay | Admitting: Internal Medicine

## 2012-09-27 ENCOUNTER — Encounter: Payer: Self-pay | Admitting: Internal Medicine

## 2012-09-27 ENCOUNTER — Ambulatory Visit (INDEPENDENT_AMBULATORY_CARE_PROVIDER_SITE_OTHER): Payer: No Typology Code available for payment source | Admitting: Internal Medicine

## 2012-09-27 VITALS — BP 130/70 | HR 84 | Temp 98.2°F | Ht 68.0 in | Wt 250.0 lb

## 2012-09-27 DIAGNOSIS — I10 Essential (primary) hypertension: Secondary | ICD-10-CM

## 2012-10-02 ENCOUNTER — Encounter: Payer: Self-pay | Admitting: Internal Medicine

## 2012-10-02 NOTE — Progress Notes (Signed)
Subjective:    Patient ID: Jennifer Delacruz, female    DOB: 1955/04/17, 58 y.o.   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   No chest pain or tightness.  No sob.  States she noticed some swelling after being at the [place].  Stood for a long period of time.  After returning home and returning to her normal routine, swelling resolved.  She went to the beach recently and noticed some increased swelling after the trip.  Ate differently, had the long ride and did a lot of standing and walking.  Noticed some increased bilateral foot and leg swelling.  No redness.  Swelling has improved now.     Past Medical History  Diagnosis Date  . Lobular hyperplasia, atypical, breast     saw Dr Linton Flemings - [facility] and Dr Sherrlyn Hock  . Hypertension   . Hypercholesterolemia   . Elevated antinuclear antibody (ANA) level     worked up by Dr Phylliss Bob    Outpatient Encounter Prescriptions as of 09/27/2012  Medication Sig Dispense Refill  . amLODipine (NORVASC) 5 MG tablet TAKE ONE (1) TABLET EACH DAY  30 tablet  6  . aspirin 81 MG tablet Take 81 mg by mouth daily.      Marland Kitchen atorvastatin (LIPITOR) 10 MG tablet TAKE ONE (1) TABLET EACH DAY  30 tablet  5  . Calcium Carb-Cholecalciferol (CALCIUM + D3) 600-200 MG-UNIT TABS Take 600 mg by mouth 2 (two) times daily.      . cholecalciferol (VITAMIN D) 1000 UNITS tablet Take 1,000 Units by mouth daily.      . Multiple Vitamin (MULTIVITAMIN) tablet Take 1 tablet by mouth daily.      Marland Kitchen triamterene-hydrochlorothiazide (DYAZIDE) 37.5-25 MG per capsule Take 1 each (1 capsule total) by mouth daily.  30 capsule  12  . vitamin E 400 UNIT capsule 400 Units. Takes 2 daily      . [DISCONTINUED] albuterol (PROVENTIL HFA;VENTOLIN HFA) 108 (90 BASE) MCG/ACT inhaler Inhale 2 puffs into the lungs  every 6 (six) hours as needed for wheezing.  1 Inhaler  0  . [DISCONTINUED] potassium chloride SA (K-DUR,KLOR-CON) 20 MEQ tablet Take 20 mEq by mouth daily as needed.       No facility-administered encounter medications on file as of 09/27/2012.    Review of Systems Patient denies any headache, lightheadedness or dizziness.  No sinus or allergy symptoms.  No chest pain, tightness or palpitations.  No increased shortness of breath, cough or congestion.  No nausea or vomiting.  No acid reflux.  No abdominal pain or cramping.  No bowel change, such as diarrhea, constipation, BRBPR or melana.  No urine change.  Swelling as outlined.  Improved.        Objective:   Physical Exam  Filed Vitals:   09/27/12 1528  BP: 130/70  Pulse: 84  Temp: 98.2 F (36.8 C)   Blood pressure recheck:  41/25  57 year old female in no acute distress.  NECK:  Supple.  Nontender.  No audible bruit.  HEART:  Appears to be regular. LUNGS:  No crackles or wheezing audible.  Respirations even and unlabored.  RADIAL PULSE:  Equal bilaterally.   ABDOMEN:  Soft, nontender.  Bowel sounds present and normal.  No audible abdominal bruit.    EXTREMITIES:  Minimal pedal  and ankle swelling.  DP pulses palpable and equal bilaterally.      Assessment & Plan:  BILATERAL PEDAL AND LOWER EXTREMITY SWELLING.  Already improving.  Monitor salt intake.  Leg elevation.  Support hose.  Follow closely.  Will hold on changing the amlodipine.    CARDIOVASCULAR.  Asymptomatic.  Continue risk factor modification.    INCREASED PSYCHOSOCIAL STRESSORS.  Doing well.  Follow.   HEALTH MAINTENANCE.  Physical 12/24/11.  Sees Dr Linton Flemings at [facility] for her mammograms.  States last mammo ok.  Due to f/u this summer.  Colonoscopy 04/15/10 revealed two polyps otherwise normal.  Recommended follow up colonoscopy 3/17.  Bone density 11/01/08 - normal.

## 2012-10-02 NOTE — Assessment & Plan Note (Signed)
Blood pressure doing better.  Off lisinopril - concern regarding angioedema.  Doing well on amlodipine.  Follow.  Follow metabolic panel.   

## 2012-10-18 ENCOUNTER — Encounter: Payer: Self-pay | Admitting: Internal Medicine

## 2012-10-18 ENCOUNTER — Ambulatory Visit (INDEPENDENT_AMBULATORY_CARE_PROVIDER_SITE_OTHER): Payer: No Typology Code available for payment source | Admitting: Internal Medicine

## 2012-10-18 VITALS — BP 130/90 | HR 84 | Temp 98.2°F | Ht 68.0 in | Wt 245.8 lb

## 2012-10-18 DIAGNOSIS — N6099 Unspecified benign mammary dysplasia of unspecified breast: Secondary | ICD-10-CM

## 2012-10-18 DIAGNOSIS — N62 Hypertrophy of breast: Secondary | ICD-10-CM

## 2012-10-18 DIAGNOSIS — I10 Essential (primary) hypertension: Secondary | ICD-10-CM

## 2012-10-18 DIAGNOSIS — E7800 Pure hypercholesterolemia, unspecified: Secondary | ICD-10-CM

## 2012-10-18 MED ORDER — FLUTICASONE PROPIONATE 50 MCG/ACT NA SUSP: 2.0000 | Freq: Every day | NASAL | Status: DC

## 2012-10-18 MED ORDER — PREDNISONE 10 MG PO TABS: ORAL_TABLET | ORAL | Status: DC

## 2012-10-18 MED ORDER — CEFDINIR 300 MG PO CAPS: 300.0000 mg | ORAL_CAPSULE | Freq: Two times a day (BID) | ORAL | Status: DC

## 2012-10-18 NOTE — Assessment & Plan Note (Signed)
Blood pressure doing better.  Off lisinopril - concern regarding angioedema.  Doing well on amlodipine.  Follow.  Follow metabolic panel.   

## 2012-10-18 NOTE — Telephone Encounter (Signed)
I called pt and gave her instructions.  Take 6 pills then decrease by 1/2 tablet each day until finish.  Ok to take at same time.

## 2012-10-18 NOTE — Assessment & Plan Note (Signed)
Followed by Dr Labriola.  Up to date with mammograms.  Follow.    

## 2012-10-18 NOTE — Progress Notes (Signed)
Subjective:    Patient ID: Jennifer Delacruz, female    DOB: [birth year], 57 y.o.   MRN: [id number]  Cough  57 year old female with past history of hypertension, hypercholesterolemia and atypical lobular hyperplasia of the right breast who comes in today for a scheduled follow up.  She reports that starting five days ago, she developed sore throat, cough and increased congestion.  Was evaluated in Urgent Care.  Given a zpak and tussionex.  Persistent increased cough.  No fever.  Eating and drinking well.  No nausea or vomiting.  Bowels stable.  States does feel some better.     Past Medical History  Diagnosis Date  . Lobular hyperplasia, atypical, breast     saw Dr Linton Flemings - [facility] and Dr Sherrlyn Hock  . Hypertension   . Hypercholesterolemia   . Elevated antinuclear antibody (ANA) level     worked up by Dr Phylliss Bob    Outpatient Encounter Prescriptions as of 10/18/2012  Medication Sig Dispense Refill  . amLODipine (NORVASC) 5 MG tablet TAKE ONE (1) TABLET EACH DAY  30 tablet  6  . aspirin 81 MG tablet Take 81 mg by mouth daily.      Marland Kitchen atorvastatin (LIPITOR) 10 MG tablet TAKE ONE (1) TABLET EACH DAY  30 tablet  5  . Calcium Carb-Cholecalciferol (CALCIUM + D3) 600-200 MG-UNIT TABS Take 600 mg by mouth 2 (two) times daily.      . chlorpheniramine-HYDROcodone (TUSSIONEX PENNKINETIC ER) 10-8 MG/5ML LQCR Take 5 mLs by mouth at bedtime as needed.      . cholecalciferol (VITAMIN D) 1000 UNITS tablet Take 1,000 Units by mouth daily.      . Multiple Vitamin (MULTIVITAMIN) tablet Take 1 tablet by mouth daily.      Marland Kitchen triamterene-hydrochlorothiazide (DYAZIDE) 37.5-25 MG per capsule Take 1 each (1 capsule total) by mouth daily.  30 capsule  12  . vitamin E 400 UNIT capsule 400 Units. Takes 2 daily      . cefdinir (OMNICEF) 300 MG capsule Take 1 capsule (300 mg total) by mouth 2 (two) times daily.  20 capsule  0  . fluticasone (FLONASE) 50 MCG/ACT nasal spray Place 2 sprays into the nose daily.  16 g  1  . predniSONE  (DELTASONE) 10 MG tablet Take 6 tablets x 1 day and then decrease by 1/2 tablet per day until down to zero mg.  39 tablet  0   No facility-administered encounter medications on file as of 10/18/2012.    Review of Systems  Respiratory: Positive for cough.   Patient denies any headache, lightheadedness or dizziness.  Increased sinus pressure and nasal congestion.  Increased sore throat.  No chest pain, tightness or palpitations.  No increased shortness of breath, but does report the increased cough and congestion.  No nausea or vomiting.  No acid reflux.  No abdominal pain or cramping.  No bowel change, such as diarrhea, constipation, BRBPR or melana.  No urine change.  Swelling improved/resolved.         Objective:   Physical Exam  Filed Vitals:   10/18/12 0802  BP: 130/90  Pulse: 84  Temp: 98.2 F (36.8 C)   Blood pressure recheck:  65/29  57 year old female in no acute distress. HEENT:  Nares - erythematous turbinates.  Minimal tenderness to palpation over the sinuses.  TMs visualized without erythema.  Oropharynx - without lesions or erythema.    NECK:  Supple.  Nontender.  No audible bruit.  HEART:  Appears to be regular. LUNGS:  No crackles or wheezing audible.  Increased cough with expiration and with taking a dep breath.  Respirations even and unlabored.  RADIAL PULSE:  Equal bilaterally.   ABDOMEN:  Soft, nontender.  Bowel sounds present and normal.  No audible abdominal bruit.    EXTREMITIES:  No swelling.   DP pulses palpable and equal bilaterally.      Assessment & Plan:  SINUSITIS/URI/BRONCHITIS.  Treat with omnicef as directed.  flonase nasal spray and saline nasal spray as directed.  Robitussin as directed.  Prednisone taper starting at 60mg  and decreasing by 5mg  each day until off.  Follow up soon to reassess.  Notify me if worsens.    BILATERAL PEDAL AND LOWER EXTREMITY SWELLING.  Improved/resolved.      CARDIOVASCULAR.  Asymptomatic.  Continue risk factor  modification.    INCREASED PSYCHOSOCIAL STRESSORS.  Doing well.  Follow.   HEALTH MAINTENANCE.  Physical 12/24/11.  Sees Dr Linton Flemings at [facility] for her mammograms.  States last mammo ok.  Was done this summer.  Need results.  Colonoscopy 04/15/10 revealed two polyps otherwise normal.  Recommended follow up colonoscopy 3/17.  Bone density 11/01/08 - normal.

## 2012-10-18 NOTE — Assessment & Plan Note (Signed)
On lipitor now.  Tolerating.  Adjusting diet.  Follow lipid profile and liver panel.    

## 2012-10-27 ENCOUNTER — Other Ambulatory Visit: Payer: Self-pay | Admitting: Internal Medicine

## 2012-11-09 ENCOUNTER — Ambulatory Visit (INDEPENDENT_AMBULATORY_CARE_PROVIDER_SITE_OTHER): Payer: No Typology Code available for payment source | Admitting: Internal Medicine

## 2012-11-09 ENCOUNTER — Encounter: Payer: Self-pay | Admitting: Internal Medicine

## 2012-11-09 VITALS — BP 120/78 | HR 77 | Temp 98.6°F | Ht 68.0 in | Wt 247.5 lb

## 2012-11-09 DIAGNOSIS — K219 Gastro-esophageal reflux disease without esophagitis: Secondary | ICD-10-CM

## 2012-11-09 DIAGNOSIS — I10 Essential (primary) hypertension: Secondary | ICD-10-CM

## 2012-11-09 MED ORDER — AMLODIPINE BESYLATE 5 MG PO TABS: ORAL_TABLET | ORAL | Status: DC

## 2012-11-13 ENCOUNTER — Encounter: Payer: Self-pay | Admitting: Internal Medicine

## 2012-11-13 DIAGNOSIS — K219 Gastro-esophageal reflux disease without esophagitis: Secondary | ICD-10-CM | POA: Insufficient documentation

## 2012-11-13 NOTE — Assessment & Plan Note (Signed)
Blood pressure doing better.  Off lisinopril - concern regarding angioedema.  Doing well on amlodipine.  Follow.  Follow metabolic panel.   

## 2012-11-13 NOTE — Assessment & Plan Note (Signed)
Zantac as directed.  Follow.   

## 2012-11-13 NOTE — Progress Notes (Signed)
Subjective:    Patient ID: Jennifer Delacruz, female    DOB: [birth year], 57 y.o.   MRN: [id number]  Cough  57 year old female with past history of hypertension, hypercholesterolemia and atypical lobular hyperplasia of the right breast who comes in today for a scheduled follow up.  She was recently seen for cough, congestion and wheezing.  Treated with abx, prednisone, etc.  See last note for details.  Is doing better.  Breathing better.  No fever.  Eating and drinking well.  No nausea or vomiting.  Bowels stable.  States does feel  better.  Still some minimal residual cough.  Overall doing well.    Past Medical History  Diagnosis Date  . Lobular hyperplasia, atypical, breast     saw Dr Linton Flemings - [facility] and Dr Sherrlyn Hock  . Hypertension   . Hypercholesterolemia   . Elevated antinuclear antibody (ANA) level     worked up by Dr Phylliss Bob    Outpatient Encounter Prescriptions as of 11/09/2012  Medication Sig Dispense Refill  . amLODipine (NORVASC) 5 MG tablet TAKE ONE (1) TABLET EACH DAY  90 tablet  1  . aspirin 81 MG tablet Take 81 mg by mouth daily.      Marland Kitchen atorvastatin (LIPITOR) 10 MG tablet TAKE ONE (1) TABLET BY MOUTH EVERY DAY  30 tablet  5  . Calcium Carb-Cholecalciferol (CALCIUM + D3) 600-200 MG-UNIT TABS Take 600 mg by mouth 2 (two) times daily.      . cholecalciferol (VITAMIN D) 1000 UNITS tablet Take 1,000 Units by mouth daily.      . fluticasone (FLONASE) 50 MCG/ACT nasal spray Place 2 sprays into the nose daily.  16 g  1  . Multiple Vitamin (MULTIVITAMIN) tablet Take 1 tablet by mouth daily.      Marland Kitchen triamterene-hydrochlorothiazide (DYAZIDE) 37.5-25 MG per capsule Take 1 each (1 capsule total) by mouth daily.  30 capsule  12  . vitamin E 400 UNIT capsule 400 Units. Takes 2 daily      . [DISCONTINUED] amLODipine (NORVASC) 5 MG tablet TAKE ONE (1) TABLET EACH DAY  30 tablet  6  . [DISCONTINUED] cefdinir (OMNICEF) 300 MG capsule Take 1 capsule (300 mg total) by mouth 2 (two) times daily.  20  capsule  0  . [DISCONTINUED] chlorpheniramine-HYDROcodone (TUSSIONEX PENNKINETIC ER) 10-8 MG/5ML LQCR Take 5 mLs by mouth at bedtime as needed.      . [DISCONTINUED] predniSONE (DELTASONE) 10 MG tablet Take 6 tablets x 1 day and then decrease by 1/2 tablet per day until down to zero mg.  39 tablet  0   No facility-administered encounter medications on file as of 11/09/2012.    Review of Systems  Respiratory: Positive for cough.   Patient denies any headache, lightheadedness or dizziness.  Sinus symptoms have resolved.  No chest pain, tightness or palpitations.  No increased shortness of breath.  Cough and congestion better.   No nausea or vomiting.  Some acid reflux.  Minimal.  No abdominal pain or cramping.  No bowel change, such as diarrhea, constipation, BRBPR or melana.  No urine change.       Objective:   Physical Exam  Filed Vitals:   11/09/12 0948  BP: 120/78  Pulse: 77  Temp: 98.6 F (33 C)   57 year old female in no acute distress. HEENT:   Nares clear.  TMs visualized without erythema.  Oropharynx - without lesions or erythema.    NECK:  Supple.  Nontender.  No audible  bruit.  HEART:  Appears to be regular. LUNGS:  No crackles or wheezing audible.  Good breath sounds bilaterally.    RADIAL PULSE:  Equal bilaterally.   ABDOMEN:  Soft, nontender.  Bowel sounds present and normal.  No audible abdominal bruit.    EXTREMITIES:  No swelling.   DP pulses palpable and equal bilaterally.      Assessment & Plan:  SINUSITIS/URI/BRONCHITIS. Better.  Some minimal residual cough.  Improving.  Treat acid.  Follow.   BILATERAL PEDAL AND LOWER EXTREMITY SWELLING.  Improved/resolved.      CARDIOVASCULAR.  Asymptomatic.  Continue risk factor modification.    INCREASED PSYCHOSOCIAL STRESSORS.  Doing well.  Follow.   HEALTH MAINTENANCE.  Physical 12/24/11.  Sees Dr Linton Flemings at [facility] for her mammograms.  States last mammo ok.  Was done this summer.  Still need results.  Colonoscopy 04/15/10  revealed two polyps otherwise normal.  Recommended follow up colonoscopy 3/17.  Bone density 11/01/08 - normal.

## 2012-11-24 ENCOUNTER — Other Ambulatory Visit: Payer: Self-pay

## 2012-12-21 ENCOUNTER — Other Ambulatory Visit: Payer: Self-pay | Admitting: Internal Medicine

## 2013-02-08 ENCOUNTER — Encounter: Payer: Self-pay | Admitting: Internal Medicine

## 2013-02-09 MED ORDER — ALPRAZOLAM 0.25 MG PO TABS: 0.2500 mg | ORAL_TABLET | Freq: Every day | ORAL | Status: AC | PRN

## 2013-02-09 NOTE — Telephone Encounter (Signed)
rx for xanax .25mg  q day prn #15 with no refills prescribed.  Order signed and in your box.

## 2013-03-13 ENCOUNTER — Encounter: Payer: No Typology Code available for payment source | Admitting: Internal Medicine

## 2013-03-22 ENCOUNTER — Ambulatory Visit (INDEPENDENT_AMBULATORY_CARE_PROVIDER_SITE_OTHER): Payer: No Typology Code available for payment source | Admitting: Internal Medicine

## 2013-03-22 ENCOUNTER — Ambulatory Visit (INDEPENDENT_AMBULATORY_CARE_PROVIDER_SITE_OTHER)
Admission: RE | Admit: 2013-03-22 | Discharge: 2013-03-22 | Disposition: A | Discharge status: Home / Self Care | Payer: No Typology Code available for payment source | Source: Ambulatory Visit | Attending: Internal Medicine | Admitting: Internal Medicine

## 2013-03-22 ENCOUNTER — Other Ambulatory Visit: Payer: Self-pay | Admitting: Internal Medicine

## 2013-03-22 ENCOUNTER — Encounter: Payer: Self-pay | Admitting: Internal Medicine

## 2013-03-22 VITALS — BP 130/80 | HR 93 | Temp 98.7°F | Ht 68.0 in | Wt 254.8 lb

## 2013-03-22 DIAGNOSIS — J069 Acute upper respiratory infection, unspecified: Secondary | ICD-10-CM

## 2013-03-22 DIAGNOSIS — N62 Hypertrophy of breast: Secondary | ICD-10-CM

## 2013-03-22 DIAGNOSIS — Z1211 Encounter for screening for malignant neoplasm of colon: Secondary | ICD-10-CM

## 2013-03-22 DIAGNOSIS — E7800 Pure hypercholesterolemia, unspecified: Secondary | ICD-10-CM

## 2013-03-22 DIAGNOSIS — I10 Essential (primary) hypertension: Secondary | ICD-10-CM

## 2013-03-22 DIAGNOSIS — R062 Wheezing: Secondary | ICD-10-CM

## 2013-03-22 DIAGNOSIS — R739 Hyperglycemia, unspecified: Secondary | ICD-10-CM

## 2013-03-22 DIAGNOSIS — R059 Cough, unspecified: Secondary | ICD-10-CM

## 2013-03-22 DIAGNOSIS — N6099 Unspecified benign mammary dysplasia of unspecified breast: Secondary | ICD-10-CM

## 2013-03-22 DIAGNOSIS — R05 Cough: Secondary | ICD-10-CM

## 2013-03-22 DIAGNOSIS — K219 Gastro-esophageal reflux disease without esophagitis: Secondary | ICD-10-CM

## 2013-03-22 MED ORDER — FLUTICASONE PROPIONATE 50 MCG/ACT NA SUSP: 2.0000 | Freq: Every day | NASAL | Status: AC

## 2013-03-22 MED ORDER — ALBUTEROL SULFATE HFA 108 (90 BASE) MCG/ACT IN AERS: 2.0000 | INHALATION_SPRAY | Freq: Four times a day (QID) | RESPIRATORY_TRACT | Status: AC | PRN

## 2013-03-22 MED ORDER — AMOXICILLIN-POT CLAVULANATE 875-125 MG PO TABS
1.0000 | ORAL_TABLET | Freq: Two times a day (BID) | ORAL | Status: DC
Start: 2013-03-22 — End: 2013-04-06

## 2013-03-22 MED ORDER — PREDNISONE 10 MG PO TABS
ORAL_TABLET | ORAL | Status: DC
Start: 2013-03-22 — End: 2013-04-06

## 2013-03-22 NOTE — Progress Notes (Signed)
Pre-visit discussion using our clinic review tool. No additional management support is needed unless otherwise documented below in the visit note.  

## 2013-03-22 NOTE — Progress Notes (Signed)
Orders placed for labs

## 2013-03-22 NOTE — Patient Instructions (Signed)
Take mucinex DM in the am and robitussin DM in the evening

## 2013-03-25 ENCOUNTER — Encounter: Payer: Self-pay | Admitting: Internal Medicine

## 2013-03-25 DIAGNOSIS — J069 Acute upper respiratory infection, unspecified: Secondary | ICD-10-CM | POA: Insufficient documentation

## 2013-03-25 NOTE — Progress Notes (Signed)
Subjective:    Patient ID: Jennifer Delacruz, female    DOB: Oct 26, 1955, 58 y.o.   MRN: [id number]  Cough  58 year old female with past history of hypertension, hypercholesterolemia and atypical lobular hyperplasia of the right breast who comes in today for a scheduled follow up.  She has been doing well.  She does report that she is continuing to have problems with nasal congestion and drainage.  Ears feel full.  Minimal sore throat.  Some increased cough and chest congestion.  No vomiting or diarrhea.  No fever.  Taking robitussin and tylenol.     Past Medical History  Diagnosis Date  . Lobular hyperplasia, atypical, breast     saw Dr Elliot Gault - [facility] and Dr Ma Hillock  . Hypertension   . Hypercholesterolemia   . Elevated antinuclear antibody (ANA) level     worked up by Dr Justine Null    Outpatient Encounter Prescriptions as of 03/22/2013  Medication Sig  . ALPRAZolam (XANAX) 0.25 MG tablet Take 1 tablet (0.25 mg total) by mouth daily as needed for anxiety.  Marland Kitchen amLODipine (NORVASC) 5 MG tablet TAKE ONE (1) TABLET EACH DAY  . aspirin 81 MG tablet Take 81 mg by mouth daily.  Marland Kitchen atorvastatin (LIPITOR) 10 MG tablet TAKE ONE (1) TABLET BY MOUTH EVERY DAY  . Calcium Carb-Cholecalciferol (CALCIUM + D3) 600-200 MG-UNIT TABS Take 600 mg by mouth 2 (two) times daily.  . cholecalciferol (VITAMIN D) 1000 UNITS tablet Take 1,000 Units by mouth daily.  . Multiple Vitamin (MULTIVITAMIN) tablet Take 1 tablet by mouth daily.  Marland Kitchen triamterene-hydrochlorothiazide (DYAZIDE) 37.5-25 MG per capsule TAKE ONE CAPSULE BY MOUTH DAILY  . vitamin E 400 UNIT capsule 400 Units. Takes 2 daily  . albuterol (PROVENTIL HFA;VENTOLIN HFA) 108 (90 BASE) MCG/ACT inhaler Inhale 2 puffs into the lungs every 6 (six) hours as needed for wheezing or shortness of breath.  Marland Kitchen amoxicillin-clavulanate (AUGMENTIN) 875-125 MG per tablet Take 1 tablet by mouth 2 (two) times daily.  . fluticasone (FLONASE) 50 MCG/ACT nasal spray Place 2 sprays into both  nostrils daily.  . predniSONE (DELTASONE) 10 MG tablet Take 6 tablets x 1 day and then decrease by 1/2 tablet per day until down to zero mg.  . [DISCONTINUED] fluticasone (FLONASE) 50 MCG/ACT nasal spray Place 2 sprays into the nose daily.    Review of Systems  Respiratory: Positive for cough.   Patient denies any headache, lightheadedness or dizziness.  Increased nasal congestion and drainage as outlined.  No chest pain, tightness or palpitations.  No increased shortness of breath.  Does report increased chest congestion and cough.  Some wheezing.  No sob.  No nausea or vomiting.  No abdominal pain or cramping.  No bowel change, such as diarrhea, constipation, BRBPR or melana.  No urine change.       Objective:   Physical Exam  Filed Vitals:   03/22/13 0854  BP: 130/80  Pulse: 93  Temp: 98.7 F (45.73 C)   58 year old female in no acute distress. HEENT:   Nares slightly erythematous turbinates.   TMs visualized without erythema.  Oropharynx - without lesions or erythema.    NECK:  Supple.  Nontender.  No audible bruit.  HEART:  Appears to be regular. LUNGS:  No crackles.  Some increased congestion.  Clears with coughing.  Increased cough with forced expiration. BREASTS:  No nipple discharge or nipple retraction.  Could not appreciate any distinct nodules or axillary adenopathy.   RADIAL PULSE:  Equal bilaterally.   ABDOMEN:  Soft, nontender.  Bowel sounds present and normal.  No audible abdominal bruit.    EXTREMITIES:  No swelling.   DP pulses palpable and equal bilaterally.      Assessment & Plan:  BILATERAL PEDAL AND LOWER EXTREMITY SWELLING.  Improved/resolved.      CARDIOVASCULAR.  Asymptomatic.  Continue risk factor modification.    INCREASED PSYCHOSOCIAL STRESSORS.  Doing well.  Follow.   HEALTH MAINTENANCE.  Physical today.  Pap 12/24/11 negative with negative HPV.  Sees Dr Elliot Gault at [facility] for her mammograms.  Colonoscopy 04/15/10 revealed two polyps otherwise normal.   Recommended follow up colonoscopy 3/17.  Bone density 11/01/08 - normal.  Mammogram 08/19/12 negative.

## 2013-03-25 NOTE — Assessment & Plan Note (Signed)
On lipitor now.  Tolerating.  Adjusting diet.  Follow lipid profile and liver panel.

## 2013-03-25 NOTE — Assessment & Plan Note (Signed)
Followed by Dr Elliot Gault.  Up to date with mammograms.  Follow.

## 2013-03-25 NOTE — Assessment & Plan Note (Signed)
Blood pressure doing better.  Off lisinopril - concern regarding angioedema.  Doing well on amlodipine.  Follow.  Follow metabolic panel.

## 2013-03-25 NOTE — Assessment & Plan Note (Signed)
Zantac as directed.  Follow.

## 2013-03-25 NOTE — Assessment & Plan Note (Addendum)
URI/sinusitis.  Symptoms as outlined.  Treat with augmentin as directed.  Mucinex DM in the am and robitussin DM in the evening.  Prednisone taper as directed.  Albuterol inhaler as directed.  Saline nasal spray and flonase as directed.  Follow.  Check cxr.

## 2013-04-06 ENCOUNTER — Encounter: Payer: Self-pay | Admitting: Internal Medicine

## 2013-04-06 ENCOUNTER — Ambulatory Visit (INDEPENDENT_AMBULATORY_CARE_PROVIDER_SITE_OTHER): Payer: No Typology Code available for payment source | Admitting: Internal Medicine

## 2013-04-06 VITALS — BP 130/80 | HR 78 | Temp 98.4°F | Ht 68.0 in | Wt 254.5 lb

## 2013-04-06 DIAGNOSIS — I10 Essential (primary) hypertension: Secondary | ICD-10-CM

## 2013-04-06 DIAGNOSIS — J069 Acute upper respiratory infection, unspecified: Secondary | ICD-10-CM

## 2013-04-06 MED ORDER — PREDNISONE 10 MG PO TABS: ORAL_TABLET | ORAL | Status: DC

## 2013-04-06 NOTE — Progress Notes (Signed)
Pre-visit discussion using our clinic review tool. No additional management support is needed unless otherwise documented below in the visit note.  

## 2013-04-09 ENCOUNTER — Encounter: Payer: Self-pay | Admitting: Internal Medicine

## 2013-04-09 NOTE — Assessment & Plan Note (Signed)
Better.  Still with persistent cough.  Do not feel abx are warranted.  Extend prednisone.  Prednisone taper as directed.   Albuterol inhaler if needed.  Mucinex/robitussin prn.  Follow.  Notify me if persistent.

## 2013-04-09 NOTE — Progress Notes (Signed)
Subjective:    Patient ID: Jennifer Delacruz, female    DOB: 1955/02/14, 58 y.o.   MRN: [id number]  Cough  58 year old female with past history of hypertension, hypercholesterolemia and atypical lobular hyperplasia of the right breast who comes in today for a scheduled follow up.  Recently evaluated for increased cough and congestion.  See last note for details.  Was treated with Augmentin and prednisone.  Feels better.  Still with some dry cough.  No sob.  No fever. Eating and drinking well.  No nausea or vomiting.  No diarrhea.  Has used the rescue inhaler prn.      Past Medical History  Diagnosis Date  . Lobular hyperplasia, atypical, breast     saw Dr Elliot Gault - [facility] and Dr Ma Hillock  . Hypertension   . Hypercholesterolemia   . Elevated antinuclear antibody (ANA) level     worked up by Dr Justine Null    Outpatient Encounter Prescriptions as of 04/06/2013  Medication Sig  . albuterol (PROVENTIL HFA;VENTOLIN HFA) 108 (90 BASE) MCG/ACT inhaler Inhale 2 puffs into the lungs every 6 (six) hours as needed for wheezing or shortness of breath.  . ALPRAZolam (XANAX) 0.25 MG tablet Take 1 tablet (0.25 mg total) by mouth daily as needed for anxiety.  Marland Kitchen amLODipine (NORVASC) 5 MG tablet TAKE ONE (1) TABLET EACH DAY  . aspirin 81 MG tablet Take 81 mg by mouth daily.  Marland Kitchen atorvastatin (LIPITOR) 10 MG tablet TAKE ONE (1) TABLET BY MOUTH EVERY DAY  . Calcium Carb-Cholecalciferol (CALCIUM + D3) 600-200 MG-UNIT TABS Take 600 mg by mouth 2 (two) times daily.  . cholecalciferol (VITAMIN D) 1000 UNITS tablet Take 1,000 Units by mouth daily.  . fluticasone (FLONASE) 50 MCG/ACT nasal spray Place 2 sprays into both nostrils daily.  . Multiple Vitamin (MULTIVITAMIN) tablet Take 1 tablet by mouth daily.  Marland Kitchen triamterene-hydrochlorothiazide (DYAZIDE) 37.5-25 MG per capsule TAKE ONE CAPSULE BY MOUTH DAILY  . vitamin E 400 UNIT capsule 400 Units. Takes 2 daily  . predniSONE (DELTASONE) 10 MG tablet Take 4 tablets x 1 day and then  decrease by 1/2 tablet ( 5mg ) per day until down to zero mg.  . [DISCONTINUED] amoxicillin-clavulanate (AUGMENTIN) 875-125 MG per tablet Take 1 tablet by mouth 2 (two) times daily.  . [DISCONTINUED] predniSONE (DELTASONE) 10 MG tablet Take 6 tablets x 1 day and then decrease by 1/2 tablet per day until down to zero mg.    Review of Systems  Respiratory: Positive for cough.   Patient denies any headache, lightheadedness or dizziness.  No increased nasal congestion.  No chest pain, tightness or palpitations.  No increased shortness of breath.  Cough and congestion better.  Dry cough persist.   No sob.  No nausea or vomiting.  No abdominal pain or cramping.  No bowel change, such as diarrhea.  Overall feels better.       Objective:   Physical Exam  Filed Vitals:   04/06/13 1017  BP: 130/80  Pulse: 78  Temp: 98.4 F (58.73 C)   58 year old female in no acute distress. HEENT:   Nares clear.  TMs visualized without erythema.  Oropharynx - without lesions or erythema.    NECK:  Supple.  Nontender.  HEART:  Appears to be regular. LUNGS:  No crackles.  No wheezing.  Some increased cough with forced expiration.     Assessment & Plan:  BILATERAL PEDAL AND LOWER EXTREMITY SWELLING.  Improved/resolved.      CARDIOVASCULAR.  Asymptomatic.  Continue risk factor modification.    INCREASED PSYCHOSOCIAL STRESSORS.  Doing well.  Follow.   HEALTH MAINTENANCE.  Physical 03/22/13.  Pap 12/24/11 negative with negative HPV.  Sees Dr Elliot Gault at [facility] for her mammograms.  Colonoscopy 04/15/10 revealed two polyps otherwise normal.  Recommended follow up colonoscopy 3/17.  Bone density 11/01/08 - normal.  Mammogram 08/19/12 negative.

## 2013-04-09 NOTE — Assessment & Plan Note (Signed)
Blood pressure doing better.  Off lisinopril - concern regarding angioedema.  Doing well on amlodipine.  Follow.  Follow metabolic panel.

## 2013-04-10 ENCOUNTER — Other Ambulatory Visit (INDEPENDENT_AMBULATORY_CARE_PROVIDER_SITE_OTHER): Payer: No Typology Code available for payment source

## 2013-04-10 DIAGNOSIS — Z1211 Encounter for screening for malignant neoplasm of colon: Secondary | ICD-10-CM

## 2013-04-10 LAB — FECAL OCCULT BLOOD, IMMUNOCHEMICAL: FECAL OCCULT BLD: NEGATIVE

## 2013-04-11 ENCOUNTER — Encounter: Payer: Self-pay | Admitting: Internal Medicine

## 2013-05-22 ENCOUNTER — Other Ambulatory Visit: Payer: Self-pay | Admitting: Internal Medicine

## 2013-06-19 ENCOUNTER — Other Ambulatory Visit: Payer: Self-pay | Admitting: Internal Medicine

## 2013-07-10 ENCOUNTER — Encounter: Payer: Self-pay | Admitting: Internal Medicine

## 2013-07-10 ENCOUNTER — Ambulatory Visit (INDEPENDENT_AMBULATORY_CARE_PROVIDER_SITE_OTHER): Payer: No Typology Code available for payment source | Admitting: Internal Medicine

## 2013-07-10 VITALS — BP 120/70 | HR 73 | Temp 98.4°F | Ht 68.0 in | Wt 256.5 lb

## 2013-07-10 DIAGNOSIS — J069 Acute upper respiratory infection, unspecified: Secondary | ICD-10-CM

## 2013-07-10 DIAGNOSIS — M25561 Pain in right knee: Secondary | ICD-10-CM

## 2013-07-10 DIAGNOSIS — E7800 Pure hypercholesterolemia, unspecified: Secondary | ICD-10-CM

## 2013-07-10 DIAGNOSIS — I10 Essential (primary) hypertension: Secondary | ICD-10-CM

## 2013-07-10 DIAGNOSIS — N62 Hypertrophy of breast: Secondary | ICD-10-CM

## 2013-07-10 DIAGNOSIS — R7309 Other abnormal glucose: Secondary | ICD-10-CM

## 2013-07-10 DIAGNOSIS — K219 Gastro-esophageal reflux disease without esophagitis: Secondary | ICD-10-CM

## 2013-07-10 DIAGNOSIS — M25569 Pain in unspecified knee: Secondary | ICD-10-CM

## 2013-07-10 DIAGNOSIS — R739 Hyperglycemia, unspecified: Secondary | ICD-10-CM

## 2013-07-10 DIAGNOSIS — N6099 Unspecified benign mammary dysplasia of unspecified breast: Secondary | ICD-10-CM

## 2013-07-10 LAB — HEPATIC FUNCTION PANEL
ALBUMIN: 4.1 g/dL (ref 3.5–5.2)
ALK PHOS: 74 U/L (ref 39–117)
ALT: 25 U/L (ref 0–35)
AST: 23 U/L (ref 0–37)
BILIRUBIN DIRECT: 0 mg/dL (ref 0.0–0.3)
Total Bilirubin: 0.8 mg/dL (ref 0.2–1.2)
Total Protein: 7.3 g/dL (ref 6.0–8.3)

## 2013-07-10 LAB — CBC WITH DIFFERENTIAL/PLATELET
BASOS ABS: 0 10*3/uL (ref 0.0–0.1)
Basophils Relative: 0.5 % (ref 0.0–3.0)
EOS ABS: 0.2 10*3/uL (ref 0.0–0.7)
Eosinophils Relative: 2.2 % (ref 0.0–5.0)
HCT: 41.8 % (ref 36.0–46.0)
HEMOGLOBIN: 14.2 g/dL (ref 12.0–15.0)
LYMPHS ABS: 1.4 10*3/uL (ref 0.7–4.0)
Lymphocytes Relative: 20.3 % (ref 12.0–46.0)
MCHC: 34 g/dL (ref 30.0–36.0)
MCV: 85.7 fl (ref 78.0–100.0)
Monocytes Absolute: 0.6 10*3/uL (ref 0.1–1.0)
Monocytes Relative: 9.2 % (ref 3.0–12.0)
NEUTROS ABS: 4.7 10*3/uL (ref 1.4–7.7)
Neutrophils Relative %: 67.8 % (ref 43.0–77.0)
PLATELETS: 275 10*3/uL (ref 150.0–400.0)
RBC: 4.88 Mil/uL (ref 3.87–5.11)
RDW: 13.1 % (ref 11.5–15.5)
WBC: 6.9 10*3/uL (ref 4.0–10.5)

## 2013-07-10 LAB — BASIC METABOLIC PANEL
BUN: 14 mg/dL (ref 6–23)
CALCIUM: 9.6 mg/dL (ref 8.4–10.5)
CHLORIDE: 102 meq/L (ref 96–112)
CO2: 32 mEq/L (ref 19–32)
CREATININE: 0.8 mg/dL (ref 0.4–1.2)
GFR: 75.63 mL/min (ref >60.00)
Glucose, Bld: 110 mg/dL — ABNORMAL HIGH (ref 70–99)
Potassium: 3.8 mEq/L (ref 3.5–5.1)
Sodium: 140 mEq/L (ref 135–145)

## 2013-07-10 LAB — TSH: TSH: 1.15 u[IU]/mL (ref 0.35–4.50)

## 2013-07-10 LAB — LIPID PANEL
CHOL/HDL RATIO: 3
Cholesterol: 177 mg/dL (ref 0–200)
HDL: 62.8 mg/dL (ref >39.00)
LDL Cholesterol: 93 mg/dL (ref 0–99)
NonHDL: 114.2
Triglycerides: 105 mg/dL (ref 0.0–149.0)
VLDL: 21 mg/dL (ref 0.0–40.0)

## 2013-07-10 LAB — HEMOGLOBIN A1C: Hgb A1c MFr Bld: 6 % (ref 4.6–6.5)

## 2013-07-10 NOTE — Progress Notes (Signed)
Pre visit review using our clinic review tool, if applicable. No additional management support is needed unless otherwise documented below in the visit note. 

## 2013-07-16 ENCOUNTER — Encounter: Payer: Self-pay | Admitting: Internal Medicine

## 2013-07-16 DIAGNOSIS — M25561 Pain in right knee: Secondary | ICD-10-CM | POA: Insufficient documentation

## 2013-07-16 NOTE — Assessment & Plan Note (Signed)
On lipitor now.  Tolerating.  Adjusting diet.  Follow lipid profile and liver panel.

## 2013-07-16 NOTE — Assessment & Plan Note (Signed)
Blood pressure doing better.  Off lisinopril - concern regarding angioedema.  Doing well on amlodipine.  Follow.  Follow metabolic panel.

## 2013-07-16 NOTE — Assessment & Plan Note (Signed)
Resolved.  Cough resolved.

## 2013-07-16 NOTE — Assessment & Plan Note (Signed)
Persistent intermittent pain.  Has tried mobic.  Helped swelling.  Has appt with [facility] 07/19/13.  Keep appt.

## 2013-07-16 NOTE — Progress Notes (Signed)
Subjective:    Patient ID: Jennifer Delacruz, female    DOB: Nov 23, 1955, 58 y.o.   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  No chest pain or tightness.  No sob.  Cough resolved.  No acid reflux.  No nausea or vomiting.  No bowel change.  She is having increased right knee pain.  Took mobic for a while.  Helped the swelling.  Still with intermittent problems (worsened over the last two months).  Has an appt with [facility] 07/19/13 for evaluation.     Past Medical History  Diagnosis Date  . Lobular hyperplasia, atypical, breast     saw Dr Elliot Gault - [facility] and Dr Ma Hillock  . Hypertension   . Hypercholesterolemia   . Elevated antinuclear antibody (ANA) level     worked up by Dr Justine Null    Outpatient Encounter Prescriptions as of 07/10/2013  Medication Sig  . albuterol (PROVENTIL HFA;VENTOLIN HFA) 108 (90 BASE) MCG/ACT inhaler Inhale 2 puffs into the lungs every 6 (six) hours as needed for wheezing or shortness of breath.  . ALPRAZolam (XANAX) 0.25 MG tablet Take 1 tablet (0.25 mg total) by mouth daily as needed for anxiety.  Marland Kitchen amLODipine (NORVASC) 5 MG tablet TAKE ONE (1) TABLET BY MOUTH EVERY DAY  . aspirin 81 MG tablet Take 81 mg by mouth daily.  Marland Kitchen atorvastatin (LIPITOR) 10 MG tablet TAKE ONE (1) TABLET BY MOUTH EVERY DAY  . Calcium Carb-Cholecalciferol (CALCIUM + D3) 600-200 MG-UNIT TABS Take 600 mg by mouth 2 (two) times daily.  . cholecalciferol (VITAMIN D) 1000 UNITS tablet Take 1,000 Units by mouth daily.  . fluticasone (FLONASE) 50 MCG/ACT nasal spray Place 2 sprays into both nostrils daily.  . Multiple Vitamin (MULTIVITAMIN) tablet Take 1 tablet by mouth daily.  . predniSONE (DELTASONE) 10 MG tablet Take 4 tablets x 1 day and then decrease by 1/2 tablet ( 5mg ) per day until down to zero mg.  . triamterene-hydrochlorothiazide (DYAZIDE)  37.5-25 MG per capsule TAKE ONE CAPSULE BY MOUTH DAILY  . vitamin E 400 UNIT capsule 400 Units. Takes 2 daily    Review of Systems Patient denies any headache, lightheadedness or dizziness.  No sinus or allergy symptoms.  No chest pain, tightness or palpitations.  No increased shortness of breath, cough or congestion.  Cough resolved.  No nausea or vomiting.  No acid reflux.  No abdominal pain or cramping.  No bowel change, such as diarrhea, constipation, BRBPR or melana.  No urine change.  Right knee pain as outlined.         Objective:   Physical Exam  Filed Vitals:   07/10/13 0809  BP: 120/70  Pulse: 73  Temp: 98.4 F (36.9 C)   Blood pressure recheck:  12/60  58 year old female in no acute distress.  HEENT:  Nares clear.  Oropharynx without lesions.   NECK:  Supple.  Nontender.  No audible bruit.  HEART:  Appears to be regular. LUNGS:  No crackles or wheezing audible.  Respirations even and unlabored.  RADIAL PULSE:  Equal bilaterally.   ABDOMEN:  Soft, nontender.  Bowel sounds present and normal.  No audible abdominal bruit.    EXTREMITIES:  No increased swelling.  DP pulses palpable and equal bilaterally.      Assessment & Plan:  CARDIOVASCULAR.  Asymptomatic.  Continue risk factor modification.  INCREASED PSYCHOSOCIAL STRESSORS.  Doing well.  Follow.   HEALTH MAINTENANCE.  Physical 03/22/13.  Sees Dr Elliot Gault at [facility] for her mammograms.  States last mammo ok.  Due to f/u this summer.  Last 08/19/13.  Colonoscopy 04/15/10 revealed two polyps otherwise normal.  Recommended follow up colonoscopy 3/17.  Bone density 11/01/08 - normal.

## 2013-07-16 NOTE — Assessment & Plan Note (Signed)
No acid reflux reported.  Follow.

## 2013-07-26 ENCOUNTER — Other Ambulatory Visit: Payer: Self-pay | Admitting: Internal Medicine

## 2013-08-25 LAB — HM MAMMOGRAPHY

## 2013-11-16 ENCOUNTER — Encounter: Payer: Self-pay | Admitting: Internal Medicine

## 2013-11-16 ENCOUNTER — Encounter: Payer: Self-pay | Admitting: *Deleted

## 2013-11-16 ENCOUNTER — Ambulatory Visit (INDEPENDENT_AMBULATORY_CARE_PROVIDER_SITE_OTHER): Payer: No Typology Code available for payment source | Admitting: Internal Medicine

## 2013-11-16 ENCOUNTER — Telehealth: Payer: Self-pay | Admitting: Internal Medicine

## 2013-11-16 ENCOUNTER — Other Ambulatory Visit: Payer: Self-pay | Admitting: Internal Medicine

## 2013-11-16 VITALS — BP 130/90 | HR 91 | Temp 98.3°F | Ht 68.0 in | Wt 258.2 lb

## 2013-11-16 DIAGNOSIS — I10 Essential (primary) hypertension: Secondary | ICD-10-CM

## 2013-11-16 DIAGNOSIS — R7989 Other specified abnormal findings of blood chemistry: Secondary | ICD-10-CM

## 2013-11-16 DIAGNOSIS — J069 Acute upper respiratory infection, unspecified: Secondary | ICD-10-CM

## 2013-11-16 DIAGNOSIS — N6099 Unspecified benign mammary dysplasia of unspecified breast: Secondary | ICD-10-CM

## 2013-11-16 DIAGNOSIS — R739 Hyperglycemia, unspecified: Secondary | ICD-10-CM

## 2013-11-16 DIAGNOSIS — N62 Hypertrophy of breast: Secondary | ICD-10-CM

## 2013-11-16 DIAGNOSIS — K219 Gastro-esophageal reflux disease without esophagitis: Secondary | ICD-10-CM

## 2013-11-16 DIAGNOSIS — R945 Abnormal results of liver function studies: Secondary | ICD-10-CM

## 2013-11-16 DIAGNOSIS — E7800 Pure hypercholesterolemia, unspecified: Secondary | ICD-10-CM

## 2013-11-16 LAB — LIPID PANEL
CHOLESTEROL: 176 mg/dL (ref 0–200)
HDL: 50.7 mg/dL (ref >39.00)
LDL CALC: 104 mg/dL — AB (ref 0–99)
NonHDL: 125.3
TRIGLYCERIDES: 107 mg/dL (ref 0.0–149.0)
Total CHOL/HDL Ratio: 3
VLDL: 21.4 mg/dL (ref 0.0–40.0)

## 2013-11-16 LAB — HEPATIC FUNCTION PANEL
ALBUMIN: 3.6 g/dL (ref 3.5–5.2)
ALT: 45 U/L — AB (ref 0–35)
AST: 33 U/L (ref 0–37)
Alkaline Phosphatase: 85 U/L (ref 39–117)
BILIRUBIN DIRECT: 0.1 mg/dL (ref 0.0–0.3)
Total Bilirubin: 0.8 mg/dL (ref 0.2–1.2)
Total Protein: 7.6 g/dL (ref 6.0–8.3)

## 2013-11-16 LAB — BASIC METABOLIC PANEL
BUN: 11 mg/dL (ref 6–23)
CHLORIDE: 99 meq/L (ref 96–112)
CO2: 30 meq/L (ref 19–32)
Calcium: 10 mg/dL (ref 8.4–10.5)
Creatinine, Ser: 0.8 mg/dL (ref 0.4–1.2)
GFR: 74.49 mL/min (ref >60.00)
GLUCOSE: 99 mg/dL (ref 70–99)
Potassium: 3.5 mEq/L (ref 3.5–5.1)
Sodium: 135 mEq/L (ref 135–145)

## 2013-11-16 LAB — HEMOGLOBIN A1C: Hgb A1c MFr Bld: 6.1 % (ref 4.6–6.5)

## 2013-11-16 MED ORDER — PREDNISONE 10 MG PO TABS: ORAL_TABLET | ORAL | Status: DC

## 2013-11-16 NOTE — Progress Notes (Signed)
Subjective:    Patient ID: Jennifer Delacruz, female    DOB: 1955/11/05, 58 y.o.   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  No acid reflux.  No nausea or vomiting.  No bowel change.  She does report that starting last week, developed sore throat.  Previous sinus pressure.  Now with increased cough - productive (yellow/clear mucus).  Sore ribs are some better.  Sore from coughing.  Decreased appetitie.  Taking tylenol.  Went to employee health.  Placed on amoxicillin.  Some better.  Persistent increased cough.      Past Medical History  Diagnosis Date  . Lobular hyperplasia, atypical, breast     saw Dr Elliot Gault - [facility] and Dr Ma Hillock  . Hypertension   . Hypercholesterolemia   . Elevated antinuclear antibody (ANA) level     worked up by Dr Justine Null    Outpatient Encounter Prescriptions as of 11/16/2013  Medication Sig  . albuterol (PROVENTIL HFA;VENTOLIN HFA) 108 (90 BASE) MCG/ACT inhaler Inhale 2 puffs into the lungs every 6 (six) hours as needed for wheezing or shortness of breath.  . ALPRAZolam (XANAX) 0.25 MG tablet Take 1 tablet (0.25 mg total) by mouth daily as needed for anxiety.  Marland Kitchen amLODipine (NORVASC) 5 MG tablet TAKE ONE (1) TABLET BY MOUTH EVERY DAY  . amoxicillin (AMOXIL) 875 MG tablet Take 875 mg by mouth 2 (two) times daily.  Marland Kitchen aspirin 81 MG tablet Take 81 mg by mouth daily.  Marland Kitchen atorvastatin (LIPITOR) 10 MG tablet TAKE ONE (1) TABLET BY MOUTH EVERY DAY  . brompheniramine-pseudoephedrine-dextromethorphan (DIMETAPP DM) 15-1-5 MG/5ML ELIX Take by mouth every 6 (six) hours as needed.  . Calcium Carb-Cholecalciferol (CALCIUM + D3) 600-200 MG-UNIT TABS Take 600 mg by mouth 2 (two) times daily.  . cholecalciferol (VITAMIN D) 1000 UNITS tablet Take 1,000 Units by mouth daily.  . fluticasone (FLONASE) 50 MCG/ACT nasal spray Place 2 sprays  into both nostrils daily.  . Multiple Vitamin (MULTIVITAMIN) tablet Take 1 tablet by mouth daily.  Marland Kitchen triamterene-hydrochlorothiazide (DYAZIDE) 37.5-25 MG per capsule TAKE ONE CAPSULE BY MOUTH DAILY  . vitamin E 400 UNIT capsule 400 Units. Takes 2 daily  . [DISCONTINUED] predniSONE (DELTASONE) 10 MG tablet Take 4 tablets x 1 day and then decrease by 1/2 tablet ( 5mg ) per day until down to zero mg.    Review of Systems Patient denies any headache, lightheadedness or dizziness. Sinus pressure is better.  No chest pain, tightness or palpitations.  Some rib pain.  No increased shortness of breath.  Does report increased cough and congestion as outlined.   No nausea or vomiting.  No acid reflux.  No abdominal pain or cramping.  No bowel change, such as diarrhea.         Objective:   Physical Exam  Filed Vitals:   11/16/13 0759  BP: 130/90  Pulse: 91  Temp: 98.3 F (62.55 C)   58 year old female in no acute distress.  HEENT:  Nares slightly erythematous turbinates.  Oropharynx without lesions.  No significant tenderness to palpation.  NECK:  Supple.  Nontender.  No audible bruit.  HEART:  Appears to be regular. LUNGS:  No crackles or wheezing audible.  Respirations even and unlabored.  Increased cough with forced expiration.    RADIAL PULSE:  Equal bilaterally.   ABDOMEN:  Soft, nontender.  Bowel  sounds present and normal.  No audible abdominal bruit.    EXTREMITIES:  No increased swelling.  DP pulses palpable and equal bilaterally.      Assessment & Plan:  CARDIOVASCULAR.  Asymptomatic.  Continue risk factor modification.    INCREASED PSYCHOSOCIAL STRESSORS.  Doing well.  Follow.   Essential hypertension Blood pressure slightly increased today.  Treat infection.  Follow.  Has been well controlled.  - Basic metabolic panel  Atypical lobular hyperplasia of breast Followed by Dr Elliot Gault.  Up to date with mammograms.   Hypercholesterolemia Low cholesterol diet and exercise.  -  Lipid panel - Hepatic function panel  Gastroesophageal reflux disease without esophagitis Controlled.    Hyperglycemia Low carb diet.  Follow.  - Hemoglobin A1c  URI (upper respiratory infection) Already on amoxicillin.  Some symptoms have improved.  Increased cough and congestion.  Exam as outlined.  Continue amoxicillin.  Prednisone taper as directed.  Albuterol inhaler as directed.  Follow.  Get her back in soon to reassess.    HEALTH MAINTENANCE.  Physical 03/22/13.  Sees Dr Elliot Gault at [facility] for her mammograms.  Last mammogram 09/14/13 - ok.  Recommend f/u mammogram in one year.  Colonoscopy 04/15/10 revealed two polyps otherwise normal.  Recommended follow up colonoscopy 3/17.  Bone density 11/01/08 - normal.

## 2013-11-16 NOTE — Patient Instructions (Signed)
Take mucinex DM in the am and robitussin DM in the evening.  Saline nasal spray - flush nose at least 2-3x/day.   Nasacort nasal spray - 2 sprays each nostril one time per day.  Do this in the evening.    Continue antibiotics.   Take prednisone taper as directed.

## 2013-11-16 NOTE — Telephone Encounter (Signed)
Pt notified of lab results via my chart.  Needs a f/u non fasting lab within one week.  Please schedule and contact her with a lab appt date and time.  Thanks.

## 2013-11-16 NOTE — Progress Notes (Signed)
Pre visit review using our clinic review tool, if applicable. No additional management support is needed unless otherwise documented below in the visit note. 

## 2013-11-24 ENCOUNTER — Other Ambulatory Visit (INDEPENDENT_AMBULATORY_CARE_PROVIDER_SITE_OTHER): Payer: No Typology Code available for payment source

## 2013-11-24 ENCOUNTER — Other Ambulatory Visit: Payer: No Typology Code available for payment source

## 2013-11-24 DIAGNOSIS — R945 Abnormal results of liver function studies: Secondary | ICD-10-CM

## 2013-11-24 DIAGNOSIS — R7989 Other specified abnormal findings of blood chemistry: Secondary | ICD-10-CM

## 2013-11-24 LAB — HEPATIC FUNCTION PANEL
ALT: 37 U/L — AB (ref 0–35)
AST: 22 U/L (ref 0–37)
Albumin: 3.3 g/dL — ABNORMAL LOW (ref 3.5–5.2)
Alkaline Phosphatase: 70 U/L (ref 39–117)
BILIRUBIN DIRECT: 0.1 mg/dL (ref 0.0–0.3)
TOTAL PROTEIN: 6.7 g/dL (ref 6.0–8.3)
Total Bilirubin: 0.4 mg/dL (ref 0.2–1.2)

## 2013-11-25 ENCOUNTER — Encounter: Payer: Self-pay | Admitting: Internal Medicine

## 2013-11-28 ENCOUNTER — Other Ambulatory Visit: Payer: Self-pay | Admitting: Internal Medicine

## 2013-12-01 ENCOUNTER — Ambulatory Visit (INDEPENDENT_AMBULATORY_CARE_PROVIDER_SITE_OTHER): Payer: No Typology Code available for payment source | Admitting: Internal Medicine

## 2013-12-01 ENCOUNTER — Encounter: Payer: Self-pay | Admitting: Internal Medicine

## 2013-12-01 VITALS — BP 127/79 | HR 75 | Temp 98.2°F | Ht 68.0 in | Wt 265.0 lb

## 2013-12-01 DIAGNOSIS — I10 Essential (primary) hypertension: Secondary | ICD-10-CM

## 2013-12-01 DIAGNOSIS — R739 Hyperglycemia, unspecified: Secondary | ICD-10-CM

## 2013-12-01 DIAGNOSIS — K219 Gastro-esophageal reflux disease without esophagitis: Secondary | ICD-10-CM

## 2013-12-01 DIAGNOSIS — E7800 Pure hypercholesterolemia, unspecified: Secondary | ICD-10-CM

## 2013-12-01 DIAGNOSIS — J069 Acute upper respiratory infection, unspecified: Secondary | ICD-10-CM

## 2013-12-01 MED ORDER — FLUTICASONE PROPIONATE HFA 110 MCG/ACT IN AERO: 2.0000 | INHALATION_SPRAY | Freq: Two times a day (BID) | RESPIRATORY_TRACT | Status: AC

## 2013-12-01 NOTE — Progress Notes (Signed)
Pre visit review using our clinic review tool, if applicable. No additional management support is needed unless otherwise documented below in the visit note. 

## 2013-12-01 NOTE — Patient Instructions (Signed)
Use the flovent inhaler - 2 puffs twice a day.  Rinse your mouth after use.    Zantac 150mg  - take one tablet 30 minutes before breakfast.

## 2013-12-03 ENCOUNTER — Encounter: Payer: Self-pay | Admitting: Internal Medicine

## 2013-12-03 NOTE — Progress Notes (Signed)
Subjective:    Patient ID: Jennifer Delacruz, female    DOB: Aug 28, 1955, 58 y.o.   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  She was seen recently and diagnosed with URI.  Treated with abx and prednisone.  She is better.  Still with some cough, but better.  No acid reflux.  No nausea or vomiting.  No bowel change.  No sob.  No chest congestion.      Past Medical History  Diagnosis Date  . Lobular hyperplasia, atypical, breast     saw Dr Elliot Gault - [facility] and Dr Ma Hillock  . Hypertension   . Hypercholesterolemia   . Elevated antinuclear antibody (ANA) level     worked up by Dr Justine Null    Outpatient Encounter Prescriptions as of 12/01/2013  Medication Sig  . albuterol (PROVENTIL HFA;VENTOLIN HFA) 108 (90 BASE) MCG/ACT inhaler Inhale 2 puffs into the lungs every 6 (six) hours as needed for wheezing or shortness of breath.  . ALPRAZolam (XANAX) 0.25 MG tablet Take 1 tablet (0.25 mg total) by mouth daily as needed for anxiety.  Marland Kitchen amLODipine (NORVASC) 5 MG tablet TAKE ONE (1) TABLET BY MOUTH EVERY DAY  . aspirin 81 MG tablet Take 81 mg by mouth daily.  Marland Kitchen atorvastatin (LIPITOR) 10 MG tablet TAKE ONE (1) TABLET BY MOUTH EVERY DAY  . Calcium Carb-Cholecalciferol (CALCIUM + D3) 600-200 MG-UNIT TABS Take 600 mg by mouth 2 (two) times daily.  . cholecalciferol (VITAMIN D) 1000 UNITS tablet Take 1,000 Units by mouth daily.  . fluticasone (FLONASE) 50 MCG/ACT nasal spray Place 2 sprays into both nostrils daily.  . Multiple Vitamin (MULTIVITAMIN) tablet Take 1 tablet by mouth daily.  Marland Kitchen triamterene-hydrochlorothiazide (DYAZIDE) 37.5-25 MG per capsule TAKE ONE CAPSULE BY MOUTH DAILY  . vitamin E 400 UNIT capsule 400 Units. Takes 2 daily  . fluticasone (FLOVENT HFA) 110 MCG/ACT inhaler Inhale 2 puffs into the lungs 2 (two) times daily. Rinse mouth after use  .  [DISCONTINUED] amoxicillin (AMOXIL) 875 MG tablet Take 875 mg by mouth 2 (two) times daily.  . [DISCONTINUED] brompheniramine-pseudoephedrine-dextromethorphan (DIMETAPP DM) 15-1-5 MG/5ML ELIX Take by mouth every 6 (six) hours as needed.  . [DISCONTINUED] predniSONE (DELTASONE) 10 MG tablet Take 6 tablets x 1 day and then decrease by 1/2 tablet per day until down to zero mg.    Review of Systems Patient denies any headache, lightheadedness or dizziness. Sinus pressure is better.  No chest pain, tightness or palpitations. No increased shortness of breath.  Still some cough.  Is better.  No nausea or vomiting.  No acid reflux.  No abdominal pain or cramping.  No bowel change, such as diarrhea.         Objective:   Physical Exam  Filed Vitals:   12/01/13 0818  BP: 127/79  Pulse: 75  Temp: 98.2 F (36.8 C)   Blood pressure recheck:  17/24  58 year old female in no acute distress.  HEENT:  Nares clear.   Oropharynx without lesions.   NECK:  Supple.  Nontender.  No audible bruit.  HEART:  Appears to be regular. LUNGS:  No crackles or wheezing audible.  Respirations even and unlabored.  Increased cough with forced expiration.    RADIAL PULSE:  Equal bilaterally.   ABDOMEN:  Soft, nontender.  Bowel sounds present and normal.  No audible abdominal bruit.  EXTREMITIES:  No increased swelling.  DP pulses palpable and equal bilaterally.      Assessment & Plan:  CARDIOVASCULAR.  Asymptomatic.  Continue risk factor modification.    Essential hypertension Blood pressure slightly doing well.    Atypical lobular hyperplasia of breast Followed by Dr Elliot Gault.  Up to date with mammograms.   Gastroesophageal reflux disease without esophagitis Controlled.    URI (upper respiratory infection) Treated.  Better.  Still with some cough.  Start flovent inhaler as directed.  Has the rescue inhaler if needed.  Follow.    HEALTH MAINTENANCE.  Physical 03/22/13.  Sees Dr Elliot Gault at [facility] for her  mammograms.  Last mammogram 09/14/13 - ok.  Recommend f/u mammogram in one year.  Colonoscopy 04/15/10 revealed two polyps otherwise normal.  Recommended follow up colonoscopy 3/17.  Bone density 11/01/08 - normal.

## 2013-12-18 ENCOUNTER — Encounter: Payer: Self-pay | Admitting: Internal Medicine

## 2013-12-18 ENCOUNTER — Other Ambulatory Visit: Payer: Self-pay | Admitting: Internal Medicine

## 2014-03-28 ENCOUNTER — Other Ambulatory Visit (INDEPENDENT_AMBULATORY_CARE_PROVIDER_SITE_OTHER): Payer: No Typology Code available for payment source

## 2014-03-28 DIAGNOSIS — I10 Essential (primary) hypertension: Secondary | ICD-10-CM

## 2014-03-28 DIAGNOSIS — E7800 Pure hypercholesterolemia, unspecified: Secondary | ICD-10-CM

## 2014-03-28 DIAGNOSIS — R739 Hyperglycemia, unspecified: Secondary | ICD-10-CM

## 2014-03-28 LAB — HEPATIC FUNCTION PANEL
ALT: 29 U/L (ref 0–35)
AST: 23 U/L (ref 0–37)
Albumin: 4.1 g/dL (ref 3.5–5.2)
Alkaline Phosphatase: 71 U/L (ref 39–117)
Bilirubin, Direct: 0.1 mg/dL (ref 0.0–0.3)
TOTAL PROTEIN: 7.1 g/dL (ref 6.0–8.3)
Total Bilirubin: 0.4 mg/dL (ref 0.2–1.2)

## 2014-03-28 LAB — BASIC METABOLIC PANEL
BUN: 13 mg/dL (ref 6–23)
CALCIUM: 9.7 mg/dL (ref 8.4–10.5)
CHLORIDE: 103 meq/L (ref 96–112)
CO2: 30 meq/L (ref 19–32)
Creatinine, Ser: 0.78 mg/dL (ref 0.40–1.20)
GFR: 79.93 mL/min (ref >60.00)
GLUCOSE: 107 mg/dL — AB (ref 70–99)
Potassium: 3.7 mEq/L (ref 3.5–5.1)
Sodium: 137 mEq/L (ref 135–145)

## 2014-03-28 LAB — LIPID PANEL
Cholesterol: 146 mg/dL (ref 0–200)
HDL: 50.6 mg/dL (ref >39.00)
LDL CALC: 79 mg/dL (ref 0–99)
NonHDL: 95.4
TRIGLYCERIDES: 81 mg/dL (ref 0.0–149.0)
Total CHOL/HDL Ratio: 3
VLDL: 16.2 mg/dL (ref 0.0–40.0)

## 2014-03-28 LAB — HEMOGLOBIN A1C: Hgb A1c MFr Bld: 5.9 % (ref 4.6–6.5)

## 2014-03-29 ENCOUNTER — Encounter: Payer: Self-pay | Admitting: Internal Medicine

## 2014-04-04 ENCOUNTER — Ambulatory Visit (INDEPENDENT_AMBULATORY_CARE_PROVIDER_SITE_OTHER): Payer: No Typology Code available for payment source | Admitting: Internal Medicine

## 2014-04-04 ENCOUNTER — Other Ambulatory Visit (HOSPITAL_COMMUNITY)
Admission: RE | Admit: 2014-04-04 | Discharge: 2014-04-04 | Disposition: A | Discharge status: Home / Self Care | Payer: No Typology Code available for payment source | Source: Ambulatory Visit | Attending: Internal Medicine | Admitting: Internal Medicine

## 2014-04-04 ENCOUNTER — Encounter: Payer: Self-pay | Admitting: Internal Medicine

## 2014-04-04 VITALS — BP 120/75 | HR 73 | Temp 98.1°F | Ht 68.0 in | Wt 245.1 lb

## 2014-04-04 DIAGNOSIS — Z0000 Encounter for general adult medical examination without abnormal findings: Secondary | ICD-10-CM

## 2014-04-04 DIAGNOSIS — Z1151 Encounter for screening for human papillomavirus (HPV): Secondary | ICD-10-CM | POA: Insufficient documentation

## 2014-04-04 DIAGNOSIS — I10 Essential (primary) hypertension: Secondary | ICD-10-CM

## 2014-04-04 DIAGNOSIS — Z01419 Encounter for gynecological examination (general) (routine) without abnormal findings: Secondary | ICD-10-CM | POA: Insufficient documentation

## 2014-04-04 DIAGNOSIS — E7800 Pure hypercholesterolemia, unspecified: Secondary | ICD-10-CM

## 2014-04-04 DIAGNOSIS — Z124 Encounter for screening for malignant neoplasm of cervix: Secondary | ICD-10-CM

## 2014-04-04 DIAGNOSIS — N62 Hypertrophy of breast: Secondary | ICD-10-CM

## 2014-04-04 DIAGNOSIS — K219 Gastro-esophageal reflux disease without esophagitis: Secondary | ICD-10-CM

## 2014-04-04 DIAGNOSIS — N6099 Unspecified benign mammary dysplasia of unspecified breast: Secondary | ICD-10-CM

## 2014-04-04 DIAGNOSIS — R739 Hyperglycemia, unspecified: Secondary | ICD-10-CM

## 2014-04-04 NOTE — Progress Notes (Signed)
Pre visit review using our clinic review tool, if applicable. No additional management support is needed unless otherwise documented below in the visit note. 

## 2014-04-05 LAB — CYTOLOGY - PAP

## 2014-04-06 ENCOUNTER — Encounter: Payer: Self-pay | Admitting: Internal Medicine

## 2014-04-08 ENCOUNTER — Encounter: Payer: Self-pay | Admitting: Internal Medicine

## 2014-04-08 DIAGNOSIS — R739 Hyperglycemia, unspecified: Secondary | ICD-10-CM | POA: Insufficient documentation

## 2014-04-08 DIAGNOSIS — Z0000 Encounter for general adult medical examination without abnormal findings: Secondary | ICD-10-CM | POA: Insufficient documentation

## 2014-04-08 NOTE — Assessment & Plan Note (Signed)
On atorvastatin.  Low cholesterol diet and exercise.  Follow lipid panel and liver function tests.  LDL just checked - 79.

## 2014-04-08 NOTE — Assessment & Plan Note (Signed)
Followed by Dr Elliot Gault.  Up to date with mammograms.  Last mammogram 08/25/13 - Birads II.

## 2014-04-08 NOTE — Progress Notes (Signed)
Patient ID: Jennifer Delacruz, female   DOB: [birth year], 59 y.o.   MRN: [id number]   Subjective:    Patient ID: Jennifer Delacruz, female    DOB: 1955/10/18, 59 y.o.   MRN: [id number]  HPI  Patient here for her physical exam.  Doing well.  Breathing well.  No increased cough or congestion.  Eating and drinking well.  No nausea or vomiting.  No acid reflux reported.  Feels she is handling stress relatively well.  Bowels stable.     Past Medical History  Diagnosis Date  . Lobular hyperplasia, atypical, breast     saw Dr Elliot Gault - [facility] and Dr Ma Hillock  . Hypertension   . Hypercholesterolemia   . Elevated antinuclear antibody (ANA) level     worked up by Dr Justine Null    Current Outpatient Prescriptions on File Prior to Visit  Medication Sig Dispense Refill  . albuterol (PROVENTIL HFA;VENTOLIN HFA) 108 (90 BASE) MCG/ACT inhaler Inhale 2 puffs into the lungs every 6 (six) hours as needed for wheezing or shortness of breath. 1 Inhaler 0  . ALPRAZolam (XANAX) 0.25 MG tablet Take 1 tablet (0.25 mg total) by mouth daily as needed for anxiety. 15 tablet 0  . amLODipine (NORVASC) 5 MG tablet TAKE ONE (1) TABLET BY MOUTH EVERY DAY 90 tablet 1  . aspirin 81 MG tablet Take 81 mg by mouth daily.    Marland Kitchen atorvastatin (LIPITOR) 10 MG tablet TAKE ONE (1) TABLET BY MOUTH EVERY DAY 30 tablet 5  . Calcium Carb-Cholecalciferol (CALCIUM + D3) 600-200 MG-UNIT TABS Take 600 mg by mouth 2 (two) times daily.    . cholecalciferol (VITAMIN D) 1000 UNITS tablet Take 1,000 Units by mouth daily.    . fluticasone (FLONASE) 50 MCG/ACT nasal spray Place 2 sprays into both nostrils daily. 16 g 1  . fluticasone (FLOVENT HFA) 110 MCG/ACT inhaler Inhale 2 puffs into the lungs 2 (two) times daily. Rinse mouth after use 1 Inhaler 1  . Multiple Vitamin (MULTIVITAMIN) tablet Take 1 tablet by mouth daily.    Marland Kitchen triamterene-hydrochlorothiazide (DYAZIDE) 37.5-25 MG per capsule TAKE ONE CAPSULE BY MOUTH DAILY 30 capsule 5  . vitamin E 400 UNIT  capsule 400 Units. Takes 2 daily     No current facility-administered medications on file prior to visit.    Review of Systems  Constitutional: Negative for appetite change and unexpected weight change.  HENT: Negative for congestion and sinus pressure.   Eyes: Negative for pain and visual disturbance.  Respiratory: Negative for cough, chest tightness and shortness of breath.   Cardiovascular: Negative for chest pain, palpitations and leg swelling.  Gastrointestinal: Negative for nausea, vomiting, abdominal pain and diarrhea.  Genitourinary: Negative for dysuria and difficulty urinating.  Skin: Negative for color change and rash.  Neurological: Negative for dizziness, light-headedness and headaches.  Psychiatric/Behavioral: Negative for dysphoric mood and agitation.       Feels she is handling stress relatively well.         Objective:    Physical Exam  Constitutional: She is oriented to person, place, and time. She appears well-developed and well-nourished.  HENT:  Nose: Nose normal.  Mouth/Throat: Oropharynx is clear and moist.  Eyes: Right eye exhibits no discharge. Left eye exhibits no discharge. No scleral icterus.  Neck: Neck supple. No thyromegaly present.  Cardiovascular: Normal rate and regular rhythm.   Pulmonary/Chest: Breath sounds normal. No accessory muscle usage. No tachypnea. No respiratory distress. She has no decreased breath sounds. She has  no wheezes. She has no rhonchi. Right breast exhibits no inverted nipple, no mass, no nipple discharge and no tenderness (no axillary adenopathy). Left breast exhibits no inverted nipple, no mass, no nipple discharge and no tenderness (no axilarry adenopathy).  Abdominal: Soft. Bowel sounds are normal. There is no tenderness.  Genitourinary:  Normal external genitalia.  Vaginal vault without lesions.  Cervix identified.  Pap smear performed.  Could not appreciate any adnexal masses or tenderness.    Musculoskeletal: She  exhibits no edema or tenderness.  Lymphadenopathy:    She has no cervical adenopathy.  Neurological: She is alert and oriented to person, place, and time.  Skin: Skin is warm. No rash noted.  Psychiatric: She has a normal mood and affect. Her behavior is normal.    BP 120/75 mmHg  Pulse 73  Temp(Src) 98.1 F (36.7 C) (Oral)  Ht 5' 8"  (1.727 m)  Wt 245 lb 2 oz (111.188 kg)  BMI 37.28 kg/m2  SpO2 96% Wt Readings from Last 3 Encounters:  04/04/14 245 lb 2 oz (111.188 kg)  12/01/13 265 lb (120.203 kg)  11/16/13 258 lb 4 oz (117.141 kg)     Lab Results  Component Value Date   WBC 6.9 07/10/2013   HGB 14.2 07/10/2013   HCT 41.8 07/10/2013   PLT 275.0 07/10/2013   GLUCOSE 107* 03/28/2014   CHOL 146 03/28/2014   TRIG 81.0 03/28/2014   HDL 50.60 03/28/2014   LDLDIRECT 115.5 07/14/2012   LDLCALC 79 03/28/2014   ALT 29 03/28/2014   AST 23 03/28/2014   NA 137 03/28/2014   K 3.7 03/28/2014   CL 103 03/28/2014   CREATININE 0.78 03/28/2014   BUN 13 03/28/2014   CO2 30 03/28/2014   TSH 1.15 07/10/2013   HGBA1C 5.9 03/28/2014    Dg Chest 2 View  03/22/2013   CLINICAL DATA:  Cough, congestion, wheezing  EXAM: CHEST  2 VIEW  COMPARISON:  None.  FINDINGS: Cardiomediastinal silhouette is unremarkable. No acute infiltrate or pleural effusion. No pulmonary edema. Mild degenerative changes thoracic spine.  IMPRESSION: No active cardiopulmonary disease.   Electronically Signed   By: Lahoma Crocker M.D.   On: 03/22/2013 10:58       Assessment & Plan:   Problem List Items Addressed This Visit    Acid reflux    No problems with acid reflux.  Follow.        Atypical lobular hyperplasia of breast    Followed by Dr Elliot Gault.  Up to date with mammograms.  Last mammogram 08/25/13 - Birads II.        Relevant Orders   CBC with Differential/Platelet   Health care maintenance    Physical 04/04/14.  PAP 04/04/14.  Mammogram 08/25/13 - birads II.  Colonoscopy 04/15/10 - two small sigmoid polyps  otherwise normal.  Recommended f/u colonoscopy in 2017.        Hypercholesterolemia    On atorvastatin.  Low cholesterol diet and exercise.  Follow lipid panel and liver function tests.  LDL just checked - 79.        Relevant Orders   Lipid panel   Hepatic function panel   Hyperglycemia    Low carb diet and exercise.  Follow met b and a1c.        Relevant Orders   TSH   Hemoglobin A1c   Microalbumin / creatinine urine ratio   Hypertension    Blood pressure doing well on current regimen.  Follow pressures.  Same medication  regimen.  Follow metabolic panel.        Relevant Orders   Basic metabolic panel    Other Visit Diagnoses    Pap smear for cervical cancer screening    -  Primary    Relevant Orders    Cytology - PAP (Completed)      I spent 25 minutes with the patient and more than 50% of the time was spent in consultation regarding the above.     Einar Pheasant, MD

## 2014-04-08 NOTE — Assessment & Plan Note (Signed)
Physical 04/04/14.  PAP 04/04/14.  Mammogram 08/25/13 - birads II.  Colonoscopy 04/15/10 - two small sigmoid polyps otherwise normal.  Recommended f/u colonoscopy in 2017.

## 2014-04-08 NOTE — Assessment & Plan Note (Signed)
No problems with acid reflux.  Follow.

## 2014-04-08 NOTE — Assessment & Plan Note (Signed)
Blood pressure doing well on current regimen.  Follow pressures.  Same medication regimen.  Follow metabolic panel.

## 2014-04-08 NOTE — Assessment & Plan Note (Signed)
Low carb diet and exercise.  Follow met b and a1c.   

## 2014-05-17 ENCOUNTER — Other Ambulatory Visit: Payer: Self-pay | Admitting: Internal Medicine

## 2014-06-13 ENCOUNTER — Other Ambulatory Visit: Payer: Self-pay | Admitting: Internal Medicine

## 2014-07-31 ENCOUNTER — Encounter: Payer: Self-pay | Admitting: Internal Medicine

## 2014-07-31 ENCOUNTER — Ambulatory Visit (INDEPENDENT_AMBULATORY_CARE_PROVIDER_SITE_OTHER): Payer: PRIVATE HEALTH INSURANCE | Admitting: Internal Medicine

## 2014-07-31 VITALS — BP 124/80 | HR 69 | Temp 98.5°F | Ht 68.0 in | Wt 222.1 lb

## 2014-07-31 DIAGNOSIS — I10 Essential (primary) hypertension: Secondary | ICD-10-CM

## 2014-07-31 DIAGNOSIS — N62 Hypertrophy of breast: Secondary | ICD-10-CM | POA: Diagnosis not present

## 2014-07-31 DIAGNOSIS — R739 Hyperglycemia, unspecified: Secondary | ICD-10-CM

## 2014-07-31 DIAGNOSIS — E7800 Pure hypercholesterolemia, unspecified: Secondary | ICD-10-CM

## 2014-07-31 DIAGNOSIS — K219 Gastro-esophageal reflux disease without esophagitis: Secondary | ICD-10-CM

## 2014-07-31 DIAGNOSIS — N6099 Unspecified benign mammary dysplasia of unspecified breast: Secondary | ICD-10-CM

## 2014-07-31 DIAGNOSIS — Z0000 Encounter for general adult medical examination without abnormal findings: Secondary | ICD-10-CM

## 2014-07-31 LAB — HEPATIC FUNCTION PANEL
ALBUMIN: 4 g/dL (ref 3.5–5.2)
ALT: 32 U/L (ref 0–35)
AST: 24 U/L (ref 0–37)
Alkaline Phosphatase: 70 U/L (ref 39–117)
Bilirubin, Direct: 0.1 mg/dL (ref 0.0–0.3)
Total Bilirubin: 0.4 mg/dL (ref 0.2–1.2)
Total Protein: 7.2 g/dL (ref 6.0–8.3)

## 2014-07-31 LAB — TSH: TSH: 2.13 u[IU]/mL (ref 0.35–4.50)

## 2014-07-31 LAB — BASIC METABOLIC PANEL
BUN: 10 mg/dL (ref 6–23)
CHLORIDE: 99 meq/L (ref 96–112)
CO2: 32 mEq/L (ref 19–32)
Calcium: 10.1 mg/dL (ref 8.4–10.5)
Creatinine, Ser: 0.8 mg/dL (ref 0.40–1.20)
GFR: 77.54 mL/min (ref >60.00)
GLUCOSE: 85 mg/dL (ref 70–99)
POTASSIUM: 4 meq/L (ref 3.5–5.1)
Sodium: 139 mEq/L (ref 135–145)

## 2014-07-31 LAB — CBC WITH DIFFERENTIAL/PLATELET
BASOS PCT: 0.6 % (ref 0.0–3.0)
Basophils Absolute: 0 10*3/uL (ref 0.0–0.1)
Eosinophils Absolute: 0.2 10*3/uL (ref 0.0–0.7)
Eosinophils Relative: 3.1 % (ref 0.0–5.0)
HCT: 43.2 % (ref 36.0–46.0)
Hemoglobin: 14.9 g/dL (ref 12.0–15.0)
LYMPHS PCT: 21.1 % (ref 12.0–46.0)
Lymphs Abs: 1.5 10*3/uL (ref 0.7–4.0)
MCHC: 34.4 g/dL (ref 30.0–36.0)
MCV: 85.4 fl (ref 78.0–100.0)
MONO ABS: 0.7 10*3/uL (ref 0.1–1.0)
Monocytes Relative: 9.1 % (ref 3.0–12.0)
NEUTROS ABS: 4.8 10*3/uL (ref 1.4–7.7)
Neutrophils Relative %: 66.1 % (ref 43.0–77.0)
PLATELETS: 297 10*3/uL (ref 150.0–400.0)
RBC: 5.06 Mil/uL (ref 3.87–5.11)
RDW: 13.3 % (ref 11.5–15.5)
WBC: 7.3 10*3/uL (ref 4.0–10.5)

## 2014-07-31 LAB — LIPID PANEL
Cholesterol: 150 mg/dL (ref 0–200)
HDL: 46.2 mg/dL (ref >39.00)
LDL Cholesterol: 80 mg/dL (ref 0–99)
NonHDL: 103.8
Total CHOL/HDL Ratio: 3
Triglycerides: 119 mg/dL (ref 0.0–149.0)
VLDL: 23.8 mg/dL (ref 0.0–40.0)

## 2014-07-31 LAB — MICROALBUMIN / CREATININE URINE RATIO
CREATININE, U: 16 mg/dL
Microalb Creat Ratio: 4.4 mg/g (ref 0.0–30.0)

## 2014-07-31 LAB — HEMOGLOBIN A1C: HEMOGLOBIN A1C: 5.6 % (ref 4.6–6.5)

## 2014-07-31 NOTE — Progress Notes (Signed)
Pre visit review using our clinic review tool, if applicable. No additional management support is needed unless otherwise documented below in the visit note. 

## 2014-07-31 NOTE — Progress Notes (Signed)
Patient ID: Jennifer Delacruz, female   DOB: 1955-05-18, 59 y.o.   MRN: [id number]   Subjective:    Patient ID: Jennifer Delacruz, female    DOB: 09-14-1955, 59 y.o.   MRN: [id number]  HPI  Patient here for a scheduled follow up.  Is doing weight watchers.  Has lost weight.  Feels better.  Is exercising.  No cardiac symptoms with increased activity or exertion.  No sob.  No increased cough or congestion currently.  Bowels stable.     Past Medical History  Diagnosis Date  . Lobular hyperplasia, atypical, breast     saw Dr Elliot Gault - [facility] and Dr Ma Hillock  . Hypertension   . Hypercholesterolemia   . Elevated antinuclear antibody (ANA) level     worked up by Dr Justine Null    Current Outpatient Prescriptions on File Prior to Visit  Medication Sig Dispense Refill  . albuterol (PROVENTIL HFA;VENTOLIN HFA) 108 (90 BASE) MCG/ACT inhaler Inhale 2 puffs into the lungs every 6 (six) hours as needed for wheezing or shortness of breath. 1 Inhaler 0  . ALPRAZolam (XANAX) 0.25 MG tablet Take 1 tablet (0.25 mg total) by mouth daily as needed for anxiety. 15 tablet 0  . amLODipine (NORVASC) 5 MG tablet TAKE ONE (1) TABLET EACH DAY 90 tablet 6  . aspirin 81 MG tablet Take 81 mg by mouth daily.    Marland Kitchen atorvastatin (LIPITOR) 10 MG tablet TAKE ONE (1) TABLET BY MOUTH EVERY DAY 30 tablet 6  . Calcium Carb-Cholecalciferol (CALCIUM + D3) 600-200 MG-UNIT TABS Take 600 mg by mouth 2 (two) times daily.    . cholecalciferol (VITAMIN D) 1000 UNITS tablet Take 1,000 Units by mouth daily.    . fluticasone (FLONASE) 50 MCG/ACT nasal spray Place 2 sprays into both nostrils daily. 16 g 1  . fluticasone (FLOVENT HFA) 110 MCG/ACT inhaler Inhale 2 puffs into the lungs 2 (two) times daily. Rinse mouth after use 1 Inhaler 1  . Multiple Vitamin (MULTIVITAMIN) tablet Take 1 tablet by mouth daily.    Marland Kitchen triamterene-hydrochlorothiazide (DYAZIDE) 37.5-25 MG per capsule TAKE ONE CAPSULE BY MOUTH DAILY 30 capsule 5  . vitamin E 400 UNIT capsule 400  Units. Takes 2 daily     No current facility-administered medications on file prior to visit.    Review of Systems  Constitutional: Negative for appetite change and unexpected weight change.  HENT: Negative for congestion and sinus pressure.   Respiratory: Negative for cough, chest tightness and shortness of breath.   Cardiovascular: Negative for chest pain, palpitations and leg swelling.  Gastrointestinal: Negative for nausea, vomiting, abdominal pain and diarrhea.  Neurological: Negative for dizziness, light-headedness and headaches.  Psychiatric/Behavioral: Negative for dysphoric mood and agitation.       Objective:     Blood pressure recheck:  128/78  Physical Exam  Constitutional: She appears well-developed. No distress.  HENT:  Nose: Nose normal.  Mouth/Throat: Oropharynx is clear and moist.  Neck: Neck supple. No thyromegaly present.  Cardiovascular: Normal rate and regular rhythm.   Pulmonary/Chest: Breath sounds normal. No respiratory distress. She has no wheezes.  Abdominal: Soft. Bowel sounds are normal. There is no tenderness.  Musculoskeletal: She exhibits no edema or tenderness.  Lymphadenopathy:    She has no cervical adenopathy.  Psychiatric: She has a normal mood and affect. Her behavior is normal.    BP 124/80 mmHg  Pulse 69  Temp(Src) 98.5 F (36.9 C) (Oral)  Ht 5' 8"  (1.727 m)  Wt 222  lb 2 oz (100.755 kg)  BMI 33.78 kg/m2  SpO2 94% Wt Readings from Last 3 Encounters:  07/31/14 222 lb 2 oz (100.755 kg)  04/04/14 245 lb 2 oz (111.188 kg)  12/01/13 265 lb (120.203 kg)     Lab Results  Component Value Date   WBC 7.3 07/31/2014   HGB 14.9 07/31/2014   HCT 43.2 07/31/2014   PLT 297.0 07/31/2014   GLUCOSE 85 07/31/2014   CHOL 150 07/31/2014   TRIG 119.0 07/31/2014   HDL 46.20 07/31/2014   LDLDIRECT 115.5 07/14/2012   LDLCALC 80 07/31/2014   ALT 32 07/31/2014   AST 24 07/31/2014   NA 139 07/31/2014   K 4.0 07/31/2014   CL 99 07/31/2014    CREATININE 0.80 07/31/2014   BUN 10 07/31/2014   CO2 32 07/31/2014   TSH 2.13 07/31/2014   HGBA1C 5.6 07/31/2014   MICROALBUR <0.7 07/31/2014       Assessment & Plan:   Problem List Items Addressed This Visit    Acid reflux    No upper symptoms reported.        Atypical lobular hyperplasia of breast    Followed by Dr Elliot Gault.  Up to date with mammograms.  Last mammogram 08/25/13 - Bir[facility] II.        Health care maintenance - Primary    Physical 04/04/14.  PAP 04/04/14 - negative with negative HPV.  Mammogram 08/25/13 [facility] II.  Is scheduled for f/u mammogram 08/31/14.  Colonoscopy 04/15/10.  Recommended f/u colonoscopy in 2017.        Hypercholesterolemia    She has adjusted her diet and lost weight.  Check lipid panel today.  She wants to stop her cholesterol medication.  Follow.        Hyperglycemia    Low carb diet and exercise.  Has adjusted her diet and lost weight.   Follow met b and a1c.        Hypertension    Blood pressure doing well.  Same medication regimen.  Follow pressures.  Follow metabolic panel.            Einar Pheasant, MD

## 2014-07-31 NOTE — Assessment & Plan Note (Signed)
Physical 04/04/14.  PAP 04/04/14 - negative with negative HPV.  Mammogram 08/25/13 birads II.  Is scheduled for f/u mammogram 08/31/14.  Colonoscopy 04/15/10.  Recommended f/u colonoscopy in 2017.

## 2014-08-01 ENCOUNTER — Encounter: Payer: Self-pay | Admitting: Internal Medicine

## 2014-08-05 ENCOUNTER — Encounter: Payer: Self-pay | Admitting: Internal Medicine

## 2014-08-05 NOTE — Assessment & Plan Note (Signed)
Low carb diet and exercise.  Has adjusted her diet and lost weight.   Follow met b and a1c.

## 2014-08-05 NOTE — Assessment & Plan Note (Signed)
Blood pressure doing well.  Same medication regimen.  Follow pressures.  Follow metabolic panel.   

## 2014-08-05 NOTE — Assessment & Plan Note (Signed)
She has adjusted her diet and lost weight.  Check lipid panel today.  She wants to stop her cholesterol medication.  Follow.

## 2014-08-05 NOTE — Assessment & Plan Note (Signed)
Followed by Dr Labriola.  Up to date with mammograms.  Last mammogram 08/25/13 - Birads II.   

## 2014-08-05 NOTE — Assessment & Plan Note (Signed)
No upper symptoms reported.   

## 2014-12-04 ENCOUNTER — Encounter: Payer: Self-pay | Admitting: Internal Medicine

## 2014-12-04 ENCOUNTER — Ambulatory Visit (INDEPENDENT_AMBULATORY_CARE_PROVIDER_SITE_OTHER): Payer: PRIVATE HEALTH INSURANCE | Admitting: Internal Medicine

## 2014-12-04 VITALS — BP 120/80 | HR 69 | Temp 98.1°F | Resp 18 | Ht 68.0 in | Wt 205.0 lb

## 2014-12-04 DIAGNOSIS — I10 Essential (primary) hypertension: Secondary | ICD-10-CM | POA: Diagnosis not present

## 2014-12-04 DIAGNOSIS — N62 Hypertrophy of breast: Secondary | ICD-10-CM | POA: Diagnosis not present

## 2014-12-04 DIAGNOSIS — M545 Low back pain, unspecified: Secondary | ICD-10-CM

## 2014-12-04 DIAGNOSIS — E7800 Pure hypercholesterolemia, unspecified: Secondary | ICD-10-CM

## 2014-12-04 DIAGNOSIS — K219 Gastro-esophageal reflux disease without esophagitis: Secondary | ICD-10-CM

## 2014-12-04 DIAGNOSIS — R739 Hyperglycemia, unspecified: Secondary | ICD-10-CM

## 2014-12-04 DIAGNOSIS — N6099 Unspecified benign mammary dysplasia of unspecified breast: Secondary | ICD-10-CM

## 2014-12-04 DIAGNOSIS — M549 Dorsalgia, unspecified: Secondary | ICD-10-CM | POA: Insufficient documentation

## 2014-12-04 LAB — COMPREHENSIVE METABOLIC PANEL
ALT: 16 U/L (ref 0–35)
AST: 18 U/L (ref 0–37)
Albumin: 4.1 g/dL (ref 3.5–5.2)
Alkaline Phosphatase: 65 U/L (ref 39–117)
BILIRUBIN TOTAL: 0.6 mg/dL (ref 0.2–1.2)
BUN: 11 mg/dL (ref 6–23)
CHLORIDE: 101 meq/L (ref 96–112)
CO2: 30 meq/L (ref 19–32)
Calcium: 10 mg/dL (ref 8.4–10.5)
Creatinine, Ser: 0.79 mg/dL (ref 0.40–1.20)
GFR: 78.58 mL/min (ref >60.00)
GLUCOSE: 101 mg/dL — AB (ref 70–99)
Potassium: 4.1 mEq/L (ref 3.5–5.1)
Sodium: 139 mEq/L (ref 135–145)
Total Protein: 7.2 g/dL (ref 6.0–8.3)

## 2014-12-04 LAB — LIPID PANEL
CHOL/HDL RATIO: 4
Cholesterol: 222 mg/dL — ABNORMAL HIGH (ref 0–200)
HDL: 55.9 mg/dL (ref >39.00)
LDL Cholesterol: 144 mg/dL — ABNORMAL HIGH (ref 0–99)
NONHDL: 166.37
Triglycerides: 111 mg/dL (ref 0.0–149.0)
VLDL: 22.2 mg/dL (ref 0.0–40.0)

## 2014-12-04 LAB — HEMOGLOBIN A1C: Hgb A1c MFr Bld: 5.6 % (ref 4.6–6.5)

## 2014-12-04 NOTE — Assessment & Plan Note (Signed)
No acid reflux reported.   

## 2014-12-04 NOTE — Assessment & Plan Note (Signed)
Continue diet and exercise.  Has lost weight.  Follow met b and a1c.

## 2014-12-04 NOTE — Assessment & Plan Note (Signed)
Blood pressure under good control.  Continue same medication regimen.  Follow pressures.  Follow metabolic panel.   

## 2014-12-04 NOTE — Assessment & Plan Note (Signed)
Intermittent flares.  Sees a chiropractor prn.  Desires no further intervention or evaluation.  Follow.

## 2014-12-04 NOTE — Assessment & Plan Note (Signed)
Off cholesterol medication now.  Has adjusted diet and lost weight.  Check lipid panel today.

## 2014-12-04 NOTE — Addendum Note (Signed)
Addended by: Karlene Einstein D on: 12/04/2014 08:39 AM   Modules accepted: Orders

## 2014-12-04 NOTE — Assessment & Plan Note (Signed)
MRI in 08/2014 negative.  Followed by oncology.  Recommended f/u mammogram in one year.

## 2014-12-04 NOTE — Progress Notes (Signed)
Patient ID: Jennifer Delacruz, female   DOB: [birth year], 59 y.o.   MRN: [id number]   Subjective:    Patient ID: Jennifer Delacruz, female    DOB: 1955-05-15, 59 y.o.   MRN: [id number]  HPI  Patient with past history of lobular hyperplasia followed at [facility], hypercholesterolemia and hypertension.  She comes in today to follow up on these issues.  She has lost weight.  Weight watchers.  Feels better.  Is still exercising.  No cardiac symptoms with increased activity or exertion.  No sob.  No acid reflux.  No abdominal pain or cramping.  Occasional low back pain.  No radiation of pain down leg.  No numbness or tingling.  Sees a chiropractor prn.  Desires no further intervention or evaluation.  Bowels stable.     Past Medical History  Diagnosis Date  . Lobular hyperplasia, atypical, breast     saw Dr Elliot Gault - [facility] and Dr Ma Hillock  . Hypertension   . Hypercholesterolemia   . Elevated antinuclear antibody (ANA) level     worked up by Dr Justine Null   Past Surgical History  Procedure Laterality Date  . Breast surgery      removal of a fibroadenoma (Dr Sharlet Salina)  . Abdominal hysterectomy      supracervical, ovaries removed - Dr Davis Gourd  . Anterior cruciate ligament repair  2003    left knee  . Knee debridement  2004    left   . Bunionectomy  1998    mortons neuroma  . Tonsilectomy/adenoidectomy with myringotomy  1970   Family History  Problem Relation Age of Onset  . Prostate cancer Father   . Heart disease Mother     myocardial infarction  . Breast cancer Neg Hx   . Colon cancer Neg Hx    Social History   Social History  . Marital Status: Unknown    Spouse Name: N/A  . Number of Children: 0  . Years of Education: N/A   Social History Main Topics  . Smoking status: Former Smoker    Quit date: 10/19/1996  . Smokeless tobacco: Never Used  . Alcohol Use: 0.0 oz/week    0 Standard drinks or equivalent per week     Comment: an occasional beer  . Drug Use: No  . Sexual Activity: Not  Asked   Other Topics Concern  . None   Social History Narrative    Outpatient Encounter Prescriptions as of 12/04/2014  Medication Sig  . albuterol (PROVENTIL HFA;VENTOLIN HFA) 108 (90 BASE) MCG/ACT inhaler Inhale 2 puffs into the lungs every 6 (six) hours as needed for wheezing or shortness of breath.  . ALPRAZolam (XANAX) 0.25 MG tablet Take 1 tablet (0.25 mg total) by mouth daily as needed for anxiety.  Marland Kitchen amLODipine (NORVASC) 5 MG tablet TAKE ONE (1) TABLET EACH DAY  . aspirin 81 MG tablet Take 81 mg by mouth daily.  . Calcium Carb-Cholecalciferol (CALCIUM + D3) 600-200 MG-UNIT TABS Take 600 mg by mouth 2 (two) times daily.  . cholecalciferol (VITAMIN D) 1000 UNITS tablet Take 1,000 Units by mouth daily.  . fluticasone (FLONASE) 50 MCG/ACT nasal spray Place 2 sprays into both nostrils daily.  . fluticasone (FLOVENT HFA) 110 MCG/ACT inhaler Inhale 2 puffs into the lungs 2 (two) times daily. Rinse mouth after use  . Multiple Vitamin (MULTIVITAMIN) tablet Take 1 tablet by mouth daily.  Marland Kitchen triamterene-hydrochlorothiazide (DYAZIDE) 37.5-25 MG per capsule TAKE ONE CAPSULE BY MOUTH DAILY  . vitamin E 400 UNIT  capsule 400 Units. Takes 2 daily  . [DISCONTINUED] atorvastatin (LIPITOR) 10 MG tablet TAKE ONE (1) TABLET BY MOUTH EVERY DAY   No facility-administered encounter medications on file as of 12/04/2014.    Review of Systems  Constitutional:       Has adjusted her diet.  Lost weight.  Feels better.    HENT: Negative for congestion and sinus pressure.   Respiratory: Negative for cough, chest tightness and shortness of breath.   Cardiovascular: Negative for chest pain, palpitations and leg swelling.  Gastrointestinal: Negative for nausea, vomiting, abdominal pain and diarrhea.  Musculoskeletal: Positive for back pain (intermittent as outlined. ). Negative for joint swelling.  Skin: Negative for color change and rash.  Neurological: Negative for dizziness, light-headedness and headaches.   Psychiatric/Behavioral: Negative for dysphoric mood and agitation.       Objective:     Blood pressure rechecked by me:  120/78  Physical Exam  Constitutional: She appears well-developed and well-nourished. No distress.  HENT:  Nose: Nose normal.  Mouth/Throat: Oropharynx is clear and moist.  Eyes: Conjunctivae are normal. Right eye exhibits no discharge. Left eye exhibits no discharge.  Neck: Neck supple. No thyromegaly present.  Cardiovascular: Normal rate and regular rhythm.   Pulmonary/Chest: Breath sounds normal. No respiratory distress. She has no wheezes.  Abdominal: Soft. Bowel sounds are normal. There is no tenderness.  Musculoskeletal: She exhibits no edema or tenderness.  Lymphadenopathy:    She has no cervical adenopathy.  Skin: No rash noted. No erythema.  Psychiatric: She has a normal mood and affect. Her behavior is normal.    BP 120/80 mmHg  Pulse 69  Temp(Src) 98.1 F (36.7 C) (Oral)  Resp 18  Ht 5' 8" (1.727 m)  Wt 205 lb (92.987 kg)  BMI 31.18 kg/m2  SpO2 96% Wt Readings from Last 3 Encounters:  12/04/14 205 lb (92.987 kg)  07/31/14 222 lb 2 oz (100.755 kg)  04/04/14 245 lb 2 oz (111.188 kg)     Lab Results  Component Value Date   WBC 7.3 07/31/2014   HGB 14.9 07/31/2014   HCT 43.2 07/31/2014   PLT 297.0 07/31/2014   GLUCOSE 85 07/31/2014   CHOL 150 07/31/2014   TRIG 119.0 07/31/2014   HDL 46.20 07/31/2014   LDLDIRECT 115.5 07/14/2012   LDLCALC 80 07/31/2014   ALT 32 07/31/2014   AST 24 07/31/2014   NA 139 07/31/2014   K 4.0 07/31/2014   CL 99 07/31/2014   CREATININE 0.80 07/31/2014   BUN 10 07/31/2014   CO2 32 07/31/2014   TSH 2.13 07/31/2014   HGBA1C 5.6 07/31/2014   MICROALBUR <0.7 07/31/2014       Assessment & Plan:   Problem List Items Addressed This Visit    Acid reflux    No acid reflux reported.       Atypical lobular hyperplasia of breast    MRI in 08/2014 negative.  Followed by oncology.  Recommended f/u  mammogram in one year.        Back pain    Intermittent flares.  Sees a chiropractor prn.  Desires no further intervention or evaluation.  Follow.       Hypercholesterolemia    Off cholesterol medication now.  Has adjusted diet and lost weight.  Check lipid panel today.        Relevant Orders   Lipid panel   Comprehensive metabolic panel   Hyperglycemia    Continue diet and exercise.  Has lost weight.  Follow met b and a1c.        Relevant Orders   Hemoglobin A1c   Hypertension - Primary    Blood pressure under good control.  Continue same medication regimen.  Follow pressures.  Follow metabolic panel.            Einar Pheasant, MD

## 2014-12-04 NOTE — Progress Notes (Signed)
Pre-visit discussion using our clinic review tool. No additional management support is needed unless otherwise documented below in the visit note.  

## 2014-12-05 ENCOUNTER — Encounter: Payer: Self-pay | Admitting: Internal Medicine

## 2014-12-10 ENCOUNTER — Other Ambulatory Visit: Payer: Self-pay | Admitting: Internal Medicine

## 2014-12-22 ENCOUNTER — Emergency Department: Payer: PRIVATE HEALTH INSURANCE

## 2014-12-22 ENCOUNTER — Emergency Department
Admission: EM | Admit: 2014-12-22 | Discharge: 2014-12-22 | Disposition: A | Discharge status: Other Acute Inpatient Hospital | Payer: PRIVATE HEALTH INSURANCE | Attending: Emergency Medicine | Admitting: Emergency Medicine

## 2014-12-22 ENCOUNTER — Encounter: Payer: Self-pay | Admitting: Emergency Medicine

## 2014-12-22 ENCOUNTER — Encounter: Payer: Self-pay | Admitting: Internal Medicine

## 2014-12-22 DIAGNOSIS — Z7951 Long term (current) use of inhaled steroids: Secondary | ICD-10-CM | POA: Insufficient documentation

## 2014-12-22 DIAGNOSIS — I10 Essential (primary) hypertension: Secondary | ICD-10-CM | POA: Diagnosis not present

## 2014-12-22 DIAGNOSIS — C7931 Secondary malignant neoplasm of brain: Secondary | ICD-10-CM | POA: Diagnosis not present

## 2014-12-22 DIAGNOSIS — Z79899 Other long term (current) drug therapy: Secondary | ICD-10-CM | POA: Insufficient documentation

## 2014-12-22 DIAGNOSIS — F419 Anxiety disorder, unspecified: Secondary | ICD-10-CM | POA: Insufficient documentation

## 2014-12-22 DIAGNOSIS — Z87891 Personal history of nicotine dependence: Secondary | ICD-10-CM | POA: Diagnosis not present

## 2014-12-22 DIAGNOSIS — R531 Weakness: Secondary | ICD-10-CM | POA: Diagnosis present

## 2014-12-22 DIAGNOSIS — Z7982 Long term (current) use of aspirin: Secondary | ICD-10-CM | POA: Insufficient documentation

## 2014-12-22 DIAGNOSIS — R930 Abnormal findings on diagnostic imaging of skull and head, not elsewhere classified: Secondary | ICD-10-CM

## 2014-12-22 LAB — CBC WITH DIFFERENTIAL/PLATELET
BASOS ABS: 0.1 10*3/uL (ref 0–0.1)
BASOS PCT: 1 %
EOS ABS: 0.6 10*3/uL (ref 0–0.7)
Eosinophils Relative: 9 %
HEMATOCRIT: 44.4 % (ref 35.0–47.0)
Hemoglobin: 14.6 g/dL (ref 12.0–16.0)
Lymphocytes Relative: 13 %
Lymphs Abs: 1 10*3/uL (ref 1.0–3.6)
MCH: 28.5 pg (ref 26.0–34.0)
MCHC: 33 g/dL (ref 32.0–36.0)
MCV: 86.3 fL (ref 80.0–100.0)
MONO ABS: 0.6 10*3/uL (ref 0.2–0.9)
MONOS PCT: 8 %
NEUTROS ABS: 5.1 10*3/uL (ref 1.4–6.5)
NEUTROS PCT: 69 %
Platelets: 267 10*3/uL (ref 150–440)
RBC: 5.14 MIL/uL (ref 3.80–5.20)
RDW: 13.3 % (ref 11.5–14.5)
WBC: 7.4 10*3/uL (ref 3.6–11.0)

## 2014-12-22 LAB — COMPREHENSIVE METABOLIC PANEL
ALK PHOS: 75 U/L (ref 38–126)
ALT: 19 U/L (ref 14–54)
ANION GAP: 7 (ref 5–15)
AST: 23 U/L (ref 15–41)
Albumin: 4.3 g/dL (ref 3.5–5.0)
BILIRUBIN TOTAL: 0.3 mg/dL (ref 0.3–1.2)
BUN: 15 mg/dL (ref 6–20)
CALCIUM: 9.6 mg/dL (ref 8.9–10.3)
CO2: 28 mmol/L (ref 22–32)
CREATININE: 0.72 mg/dL (ref 0.44–1.00)
Chloride: 104 mmol/L (ref 101–111)
GFR calc non Af Amer: >60 mL/min (ref >60)
GLUCOSE: 105 mg/dL — AB (ref 65–99)
Potassium: 3.7 mmol/L (ref 3.5–5.1)
Sodium: 139 mmol/L (ref 135–145)
TOTAL PROTEIN: 7.7 g/dL (ref 6.5–8.1)

## 2014-12-22 LAB — URINALYSIS COMPLETE WITH MICROSCOPIC (ARMC ONLY)
BACTERIA UA: NONE SEEN
BILIRUBIN URINE: NEGATIVE
GLUCOSE, UA: NEGATIVE mg/dL
Hgb urine dipstick: NEGATIVE
KETONES UR: NEGATIVE mg/dL
Leukocytes, UA: NEGATIVE
Nitrite: NEGATIVE
Protein, ur: NEGATIVE mg/dL
Specific Gravity, Urine: 1.003 — ABNORMAL LOW (ref 1.005–1.030)
pH: 7 (ref 5.0–8.0)

## 2014-12-22 LAB — TROPONIN I

## 2014-12-22 LAB — PROTIME-INR
INR: 1
PROTHROMBIN TIME: 13.4 s (ref 11.4–15.0)

## 2014-12-22 LAB — APTT: aPTT: 25 seconds (ref 24–36)

## 2014-12-22 MED ORDER — GADOBENATE DIMEGLUMINE 529 MG/ML IV SOLN
20.0000 mL | Freq: Once | INTRAVENOUS | Status: AC | PRN
  Administered 2014-12-22: 19 mL via INTRAVENOUS

## 2014-12-22 MED ORDER — DIAZEPAM 5 MG/ML IJ SOLN
2.0000 mg | Freq: Once | INTRAMUSCULAR | Status: AC
  Administered 2014-12-22: 2 mg via INTRAVENOUS
  Filled 2014-12-22: qty 2

## 2014-12-22 MED ORDER — DEXAMETHASONE SODIUM PHOSPHATE 10 MG/ML IJ SOLN
10.0000 mg | Freq: Once | INTRAMUSCULAR | Status: AC
  Administered 2014-12-22: 10 mg via INTRAVENOUS
  Filled 2014-12-22: qty 1

## 2014-12-22 NOTE — ED Notes (Signed)
Pt returned from CT °

## 2014-12-22 NOTE — ED Notes (Signed)
Pt returned from MRI °

## 2014-12-22 NOTE — Progress Notes (Signed)
   12/22/14 1500  Clinical Encounter Type  Visited With Patient and family together  Visit Type Initial  Referral From Physician  Spiritual Encounters  Spiritual Needs Prayer;Emotional  Stress Factors  Patient Stress Factors Health changes  Chaplain engaged family and patient with prayer and support. Offered prayer and space for family and patient to express feelings regarding health of patient. Chaplain Marcello Moores

## 2014-12-22 NOTE — ED Notes (Signed)
Chaplain present to talk with pt and family per pt request.

## 2014-12-22 NOTE — ED Provider Notes (Signed)
[facility] Emergency Department Provider Note  ____________________________________________  Time seen: 10 AM  I have reviewed the triage vital signs and the nursing notes.   HISTORY  Chief Complaint Extremity Weakness    HPI Jennifer Delacruz is a 59 y.o. female who presents with approximately 5 days of decreased coordination in her left arm. She is noticed difficulty with things that are usually easy for her such as putting on her seatbelt or closing her car door. She is generally very good typist and she is having difficulty typing with her left hand. She denies left leg weakness. No difficulty speaking. No headache, no nausea, no vomiting. No blood thinners. She does have a history of high blood pressure which is controlled with medication. No history of the same.     Past Medical History  Diagnosis Date  . Lobular hyperplasia, atypical, breast     saw Dr Elliot Gault - [facility] and Dr Ma Hillock  . Hypertension   . Hypercholesterolemia   . Elevated antinuclear antibody (ANA) level     worked up by Dr Justine Null    Patient Active Problem List   Diagnosis Date Noted  . Back pain 12/04/2014  . Health care maintenance 04/08/2014  . Hyperglycemia 04/08/2014  . Right knee pain 07/16/2013  . URI (upper respiratory infection) 03/25/2013  . Acid reflux 11/13/2012  . Atypical lobular hyperplasia of breast 11/25/2011  . Hypertension 11/25/2011  . Hypercholesterolemia 11/25/2011    Past Surgical History  Procedure Laterality Date  . Breast surgery      removal of a fibroadenoma (Dr Sharlet Salina)  . Abdominal hysterectomy      supracervical, ovaries removed - Dr Davis Gourd  . Anterior cruciate ligament repair  2003    left knee  . Knee debridement  2004    left   . Bunionectomy  1998    mortons neuroma  . Tonsilectomy/adenoidectomy with myringotomy  1970    Current Outpatient Rx  Name  Route  Sig  Dispense  Refill  . albuterol (PROVENTIL HFA;VENTOLIN HFA) 108 (90  BASE) MCG/ACT inhaler   Inhalation   Inhale 2 puffs into the lungs every 6 (six) hours as needed for wheezing or shortness of breath.   1 Inhaler   0   . ALPRAZolam (XANAX) 0.25 MG tablet   Oral   Take 1 tablet (0.25 mg total) by mouth daily as needed for anxiety.   15 tablet   0   . amLODipine (NORVASC) 5 MG tablet      TAKE ONE (1) TABLET EACH DAY   90 tablet   6   . aspirin 81 MG tablet   Oral   Take 81 mg by mouth daily.         . Calcium Carb-Cholecalciferol (CALCIUM + D3) 600-200 MG-UNIT TABS   Oral   Take 600 mg by mouth 2 (two) times daily.         . cholecalciferol (VITAMIN D) 1000 UNITS tablet   Oral   Take 1,000 Units by mouth daily.         . fluticasone (FLONASE) 50 MCG/ACT nasal spray   Each Nare   Place 2 sprays into both nostrils daily.   16 g   1   . fluticasone (FLOVENT HFA) 110 MCG/ACT inhaler   Inhalation   Inhale 2 puffs into the lungs 2 (two) times daily. Rinse mouth after use   1 Inhaler   1   . Multiple Vitamin (MULTIVITAMIN) tablet  Oral   Take 1 tablet by mouth daily.         Marland Kitchen triamterene-hydrochlorothiazide (DYAZIDE) 37.5-25 MG capsule      TAKE ONE CAPSULE BY MOUTH DAILY   30 capsule   3   . vitamin E 400 UNIT capsule      400 Units. Takes 2 daily           Allergies Codeine; Demerol; Lisinopril; Pravachol; and Simvastatin  Family History  Problem Relation Age of Onset  . Prostate cancer Father   . Heart disease Mother     myocardial infarction  . Breast cancer Neg Hx   . Colon cancer Neg Hx     Social History Social History  Substance Use Topics  . Smoking status: Former Smoker    Types: Cigarettes    Quit date: 10/19/1996  . Smokeless tobacco: Never Used  . Alcohol Use: 0.0 oz/week    0 Standard drinks or equivalent per week     Comment: an occasional beer    Review of Systems  Constitutional: Negative for fever. Eyes: Negative for visual changes. ENT: Negative for sore  throat Cardiovascular: Negative for chest pain. Respiratory: Negative for shortness of breath. Gastrointestinal: Negative for abdominal pain, vomiting and diarrhea. Genitourinary: Negative for dysuria. Musculoskeletal: Negative for back pain. Skin: Negative for rash. Neurological: Negative for headaches or focal weakness Psychiatric: Mild anxiety    ____________________________________________   PHYSICAL EXAM:  VITAL SIGNS: ED Triage Vitals  Enc Vitals Group     BP 12/22/14 0935 185/83 mmHg     Pulse Rate 12/22/14 0935 93     Resp 12/22/14 0935 18     Temp 12/22/14 0935 98 F (36.7 C)     Temp Source 12/22/14 0935 Oral     SpO2 12/22/14 0935 99 %     Weight 12/22/14 0935 205 lb (92.987 kg)     Height 12/22/14 0935 '5\' 9"'$  (1.753 m)     Head Cir --      Peak Flow --      Pain Score --      Pain Loc --      Pain Edu? --      Excl. in [place]? --      Constitutional: Alert and oriented. Well appearing and in no distress. Eyes: Conjunctivae are normal. PERRLA, EOMI ENT   Head: Normocephalic and atraumatic.   Mouth/Throat: Mucous membranes are moist. Cardiovascular: Normal rate, regular rhythm. Normal and symmetric distal pulses are present in all extremities. No murmurs, rubs, or gallops. Respiratory: Normal respiratory effort without tachypnea nor retractions. Breath sounds are clear and equal bilaterally.  Gastrointestinal: Soft and non-tender in all quadrants. No distention. There is no CVA tenderness. Genitourinary: deferred Musculoskeletal: Nontender with normal range of motion in all extremities. No lower extremity tenderness nor edema. Neurologic:  Normal speech and language. No gross focal neurologic deficits are appreciated. Strength is normal in all extremities. Negative Romberg, no dysdiadochokinesis, cranial nerves II-12 are normal Skin:  Skin is warm, dry and intact. No rash noted. Psychiatric: Mood and affect are normal. Patient exhibits appropriate insight  and judgment.  ____________________________________________    LABS (pertinent positives/negatives)  Labs Reviewed  APTT  COMPREHENSIVE METABOLIC PANEL  CBC WITH DIFFERENTIAL/PLATELET  TROPONIN I  PROTIME-INR  URINALYSIS COMPLETEWITH MICROSCOPIC ([facility] ONLY)    ____________________________________________   EKG  ED ECG REPORT I, Lavonia Drafts, the attending physician, personally viewed and interpreted this ECG.  Date: 12/22/2014 EKG Time: 9:38 AM Rate: 96  Rhythm: normal sinus rhythm QRS Axis: normal Intervals: normal ST/T Wave abnormalities: Nonspecific Conduction Disutrbances: none Narrative Interpretation: unremarkable   ____________________________________________    RADIOLOGY I have personally reviewed any xrays that were ordered on this patient: Called by radiologist regarding likely mass on CT scan of the head  ____________________________________________   PROCEDURES  Procedure(s) performed: none  Critical Care performed:yes  CRITICAL CARE Performed by: Lavonia Drafts   Total critical care time: 35 minutes  Critical care time was exclusive of separately billable procedures and treating other patients.  Critical care was necessary to treat or prevent imminent or life-threatening deterioration.  Critical care was time spent personally by me on the following activities: development of treatment plan with patient and/or surrogate as well as nursing, discussions with consultants, evaluation of patient's response to treatment, examination of patient, obtaining history from patient or surrogate, ordering and performing treatments and interventions, ordering and review of laboratory studies, ordering and review of radiographic studies, pulse oximetry and re-evaluation of patient's condition.   ____________________________________________   INITIAL IMPRESSION / ASSESSMENT AND PLAN / ED COURSE  Pertinent labs & imaging results that were available  during my care of the patient were reviewed by me and considered in my medical decision making (see chart for details).  Patient's history of present illness is concerning for CVA. On neurologic exam her strength is normal and sensation is grossly normal. We will send for CT head, some labs and reevaluate  Contacted by radiologist in regards to likely mass on CT of the head. He recommends MRI of the brain with contrast. I will give the patient IV Decadron   ----------------------------------------- 3:25 PM on 12/22/2014 -----------------------------------------  Discussed findings with the patient, chaplain in the room with patient. She requests transfer to [facility] as she has had treatment there in the past.  Discussed with Dr.Graffagnino accepts the patient to [facility]  ____________________________________________   FINAL CLINICAL IMPRESSION(S) / ED DIAGNOSES  Final diagnoses:  Brain metastases ([place])     Lavonia Drafts, MD 12/22/14 1526

## 2014-12-22 NOTE — ED Notes (Signed)
Patient transported to CT 

## 2014-12-22 NOTE — ED Notes (Signed)
Pt states for about a week now, she has noticed an progressively worsening inability to use her left arm. States she has noticed she is drifting to the left more and more this week, states she has been having trouble putting her coat and seatbelt on, anything involving her left arm. Denies numbness or tingling, denies left lower leg weakness, denies slurred speech, face is symmetrical.  States even having  trouble typing. Slight left arm drift noted. NIH 1.

## 2014-12-22 NOTE — ED Notes (Signed)
Patient transported to MRI 

## 2014-12-25 NOTE — Telephone Encounter (Signed)
Message received 12//5/16.  Pt went to ER 12/22/14.  See chart for details.

## 2014-12-31 ENCOUNTER — Encounter: Payer: Self-pay | Admitting: Internal Medicine

## 2015-01-29 ENCOUNTER — Encounter: Payer: Self-pay | Admitting: Nurse Practitioner

## 2015-01-29 ENCOUNTER — Ambulatory Visit (INDEPENDENT_AMBULATORY_CARE_PROVIDER_SITE_OTHER): Payer: Managed Care, Other (non HMO) | Admitting: Nurse Practitioner

## 2015-01-29 VITALS — BP 124/76 | HR 80 | Temp 98.7°F | Resp 14 | Ht 68.0 in | Wt 208.8 lb

## 2015-01-29 DIAGNOSIS — R05 Cough: Secondary | ICD-10-CM

## 2015-01-29 DIAGNOSIS — R059 Cough, unspecified: Secondary | ICD-10-CM

## 2015-01-29 NOTE — Patient Instructions (Signed)
Up the Zantac, follow up with ENT, use the inhaler at home and Robitussin DM.

## 2015-01-29 NOTE — Progress Notes (Signed)
Patient ID: Jennifer Delacruz, female    DOB: 01/20/1955  Age: 60 y.o. MRN: [id number]  CC: Cough and Sinusitis   HPI Jennifer Delacruz presents for Cough x 2 weeks.   1) Cough- green to clear now  Nose bleeds Thinks it is sinusitis  Levaquin daily for 2 weeks  Decadron daily  Not using the albuterol inhaler  Robitussin D- helpful   History Ettel has a past medical history of Lobular hyperplasia, atypical, breast; Hypertension; Hypercholesterolemia; Elevated antinuclear antibody (ANA) level; Lung cancer ([place]); and Brain metastasis ([place]).   She has past surgical history that includes Breast surgery; Abdominal hysterectomy; Anterior cruciate ligament repair (2003); Knee debridement (2004); Bunionectomy (1998); and Tonsilectomy/adenoidectomy with myringotomy (1970).   Her family history includes Heart disease in her mother; Prostate cancer in her father. There is no history of Breast cancer or Colon cancer.She reports that she quit smoking about 18 years ago. Her smoking use included Cigarettes. She has never used smokeless tobacco. She reports that she drinks alcohol. She reports that she does not use illicit drugs.  Outpatient Prescriptions Prior to Visit  Medication Sig Dispense Refill  . albuterol (PROVENTIL HFA;VENTOLIN HFA) 108 (90 BASE) MCG/ACT inhaler Inhale 2 puffs into the lungs every 6 (six) hours as needed for wheezing or shortness of breath. 1 Inhaler 0  . ALPRAZolam (XANAX) 0.25 MG tablet Take 1 tablet (0.25 mg total) by mouth daily as needed for anxiety. 15 tablet 0  . amLODipine (NORVASC) 5 MG tablet TAKE ONE (1) TABLET EACH DAY 90 tablet 6  . aspirin 81 MG tablet Take 81 mg by mouth daily.    . Calcium Carb-Cholecalciferol (CALCIUM + D3) 600-200 MG-UNIT TABS Take 600 mg by mouth 2 (two) times daily.    . cholecalciferol (VITAMIN D) 1000 UNITS tablet Take 1,000 Units by mouth daily.    . fluticasone (FLONASE) 50 MCG/ACT nasal spray Place 2 sprays into both nostrils daily. 16 g 1   . fluticasone (FLOVENT HFA) 110 MCG/ACT inhaler Inhale 2 puffs into the lungs 2 (two) times daily. Rinse mouth after use 1 Inhaler 1  . Multiple Vitamin (MULTIVITAMIN) tablet Take 1 tablet by mouth daily.    Marland Kitchen triamterene-hydrochlorothiazide (DYAZIDE) 37.5-25 MG capsule TAKE ONE CAPSULE BY MOUTH DAILY 30 capsule 3  . vitamin E 400 UNIT capsule 400 Units. Takes 2 daily     No facility-administered medications prior to visit.    ROS Review of Systems  Constitutional: Negative for fever, chills, diaphoresis and fatigue.  HENT: Positive for nosebleeds. Negative for postnasal drip, rhinorrhea, sinus pressure, sneezing and sore throat.   Respiratory: Positive for cough.     Objective:  BP 124/76 mmHg  Pulse 80  Temp(Src) 98.7 F (37.1 C) (Oral)  Resp 14  Ht '5\' 8"'$  (1.727 m)  Wt 208 lb 12.8 oz (94.711 kg)  BMI 31.76 kg/m2  SpO2 96%  Physical Exam  Constitutional: She is oriented to person, place, and time. She appears well-developed and well-nourished. No distress.  HENT:  Head: Normocephalic and atraumatic.  Right Ear: External ear normal.  Left Ear: External ear normal.  Mouth/Throat: Oropharynx is clear and moist.  Eyes: Right eye exhibits no discharge. Left eye exhibits no discharge. No scleral icterus.  Cardiovascular: Normal rate, regular rhythm and normal heart sounds.  Exam reveals no gallop and no friction rub.   No murmur heard. Pulmonary/Chest: Effort normal and breath sounds normal. No respiratory distress. She has no wheezes. She has no rales. She exhibits  no tenderness.  Neurological: She is alert and oriented to person, place, and time.  Skin: Skin is warm and dry. No rash noted. She is not diaphoretic.  Psychiatric: She has a normal mood and affect. Her behavior is normal. Judgment and thought content normal.   Assessment & Plan:   Keasia was seen today for cough and sinusitis.  Diagnoses and all orders for this visit:  Cough   I have discontinued Ms.  Maler's dexamethasone. I am also having her maintain her aspirin, multivitamin, cholecalciferol, vitamin E, Calcium + D3, ALPRAZolam, albuterol, fluticasone, fluticasone, amLODipine, triamterene-hydrochlorothiazide, levofloxacin, Acetaminophen, Calcium-Vitamin D, LORazepam, and ranitidine.  Meds ordered this encounter  Medications  . levofloxacin (LEVAQUIN) 500 MG tablet    Sig: TK 1 T PO  ONCE D FOR 1 DAYS    Refill:  0  . DISCONTD: dexamethasone (DECADRON) 1 MG tablet    Sig: TK 3 TS PO QID FOR 7 DAYS    Refill:  0  . Acetaminophen 500 MG coapsule    Sig: Take by mouth.  . Calcium-Vitamin D 600-200 MG-UNIT tablet    Sig: Take by mouth.  . dexamethasone (DECADRON) 4 MG tablet    Sig: Take by mouth.  Marland Kitchen LORazepam (ATIVAN) 1 MG tablet    Sig: Take one tablet by mouth 60 minutes prior to radiation procedure  . ranitidine (ZANTAC) 150 MG tablet    Sig: Take by mouth.     Follow-up: Return if symptoms worsen or fail to improve.

## 2015-02-03 DIAGNOSIS — R05 Cough: Secondary | ICD-10-CM | POA: Insufficient documentation

## 2015-02-03 DIAGNOSIS — R059 Cough, unspecified: Secondary | ICD-10-CM | POA: Insufficient documentation

## 2015-02-03 NOTE — Assessment & Plan Note (Signed)
New onset Pt following up with ENT tomorrow Counseled with Dr. Nicki Reaper who saw pt during the visit Up the Zantac, follow up with ENT, use the inhaler at home and Robitussin DM were the instructions.  FU prn worsening/failure to improve.

## 2015-02-07 ENCOUNTER — Encounter: Payer: Self-pay | Admitting: Internal Medicine

## 2015-04-03 ENCOUNTER — Encounter: Payer: Self-pay | Admitting: Internal Medicine

## 2015-04-10 ENCOUNTER — Encounter: Payer: PRIVATE HEALTH INSURANCE | Admitting: Internal Medicine

## 2015-04-22 ENCOUNTER — Other Ambulatory Visit: Payer: Self-pay | Admitting: Internal Medicine

## 2015-06-03 ENCOUNTER — Other Ambulatory Visit: Payer: Self-pay | Admitting: Internal Medicine

## 2015-06-04 ENCOUNTER — Telehealth: Payer: Self-pay | Admitting: Internal Medicine

## 2015-06-04 NOTE — Telephone Encounter (Signed)
Patient does not have any active referral. If a new referral news to done please advise the md. Please advise, thanks

## 2015-06-04 NOTE — Telephone Encounter (Signed)
Please advise, thanks.

## 2015-06-04 NOTE — Telephone Encounter (Signed)
Per [facility] they have found home health for pt. [facility]. That is all the information that was given to me.

## 2015-06-04 NOTE — Telephone Encounter (Signed)
Juliann Pulse [phone] called from [facility] to just update and let us know they are working on the referral. Thank you!

## 2015-06-26 ENCOUNTER — Encounter
Admission: RE | Admit: 2015-06-26 | Discharge: 2015-06-26 | Disposition: A | Discharge status: Home / Self Care | Payer: PRIVATE HEALTH INSURANCE | Source: Ambulatory Visit | Attending: Internal Medicine | Admitting: Internal Medicine

## 2015-07-20 ENCOUNTER — Encounter
Admission: RE | Admit: 2015-07-20 | Discharge: 2015-07-20 | Disposition: A | Discharge status: Home / Self Care | Payer: PRIVATE HEALTH INSURANCE | Source: Ambulatory Visit | Attending: Internal Medicine | Admitting: Internal Medicine

## 2015-08-08 ENCOUNTER — Telehealth: Payer: Self-pay | Admitting: *Deleted

## 2015-08-08 NOTE — Telephone Encounter (Signed)
Received a fax notification that Ms. Staples was admitted to [facility] on 08/06/15

## 2015-08-19 ENCOUNTER — Telehealth: Payer: Self-pay | Admitting: Internal Medicine

## 2015-08-19 NOTE — Telephone Encounter (Signed)
Spoke with the patient, no emergency just wants to talk to you at your convenience, thanks

## 2015-08-19 NOTE — Telephone Encounter (Signed)
Please call pt and let her know that I will call her.  Just make sure it is not anything urgent.  Thanks

## 2015-08-19 NOTE — Telephone Encounter (Signed)
Pt called about wanting to speak Dr Nicki Reaper about some things. No emergency!  Call pt @ [phone]. Thank you!

## 2015-08-19 NOTE — Telephone Encounter (Signed)
Please advise if you want me to return a call? thanks

## 2015-08-20 NOTE — Telephone Encounter (Signed)
Called pt and discussed her current condition.  She is in hospice home.

## 2015-09-10 ENCOUNTER — Telehealth: Payer: Self-pay | Admitting: *Deleted

## 2015-09-10 NOTE — Telephone Encounter (Signed)
Received notification that patient passed away at  [facility] on 09/12/2015

## 2015-09-20 DEATH — deceased

## 2017-11-03 IMAGING — MR MR HEAD WO/W CM
12 of 13 series · 42 of 48 positions shown · IV contrast (multihance)
Comparison: CT head earlier today.

CLINICAL DATA: Increasing LEFT arm weakness for 1 week. History of
atypical lobular hyperplasia of the breast.

EXAM:
MRI HEAD WITHOUT AND WITH CONTRAST
TECHNIQUE: Multiplanar, multiecho pulse sequences of the brain and surrounding
structures were obtained without and with intravenous contrast.
CONTRAST:  19mL MULTIHANCE GADOBENATE DIMEGLUMINE 529 MG/ML IV SOLN

[Series 2: T1 · sagittal · 5.0mm · 0.47mm/px · 3 of 29 slices shown]
[im 1/29]
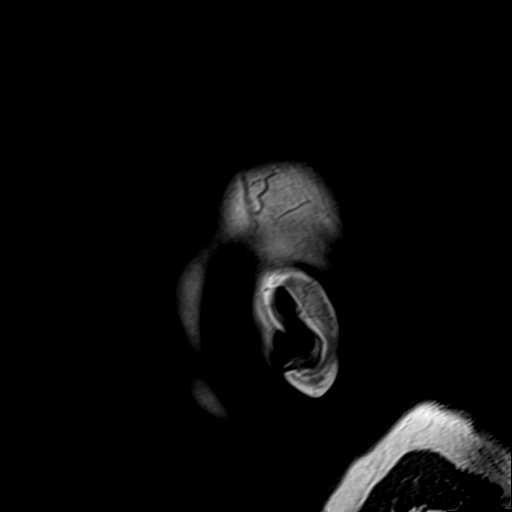
[im 10/29]
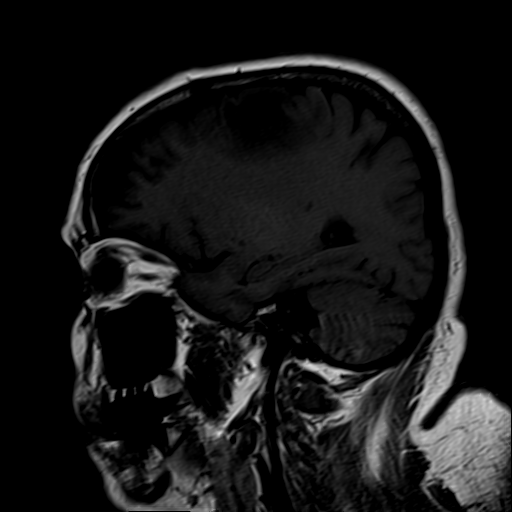
[im 19/29]
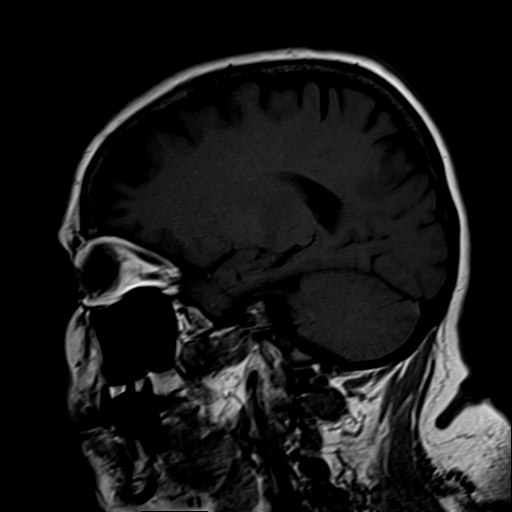

[Series 4: DWI · axial · 3.0mm · 1.80mm/px · z∈[-39,+107]mm · 6 of 56 slices shown (1 of 4)]
[im 1/56]
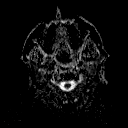
[im 12/56]
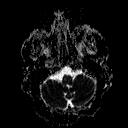
[im 23/56]
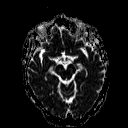
[im 34/56]
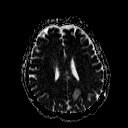
[im 45/56]
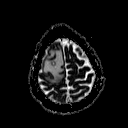
[im 56/56]
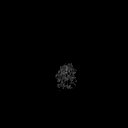

[Series 6: DWI · coronal · 3.0mm · 1.80mm/px · 4 of 49 slices shown (2 of 4)]
[im 1/49]
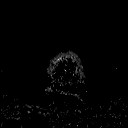
[im 17/49]
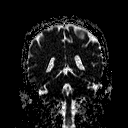
[im 33/49]
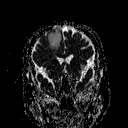
[im 49/49]
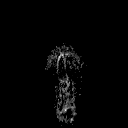

[Series 7: T2 · axial · 5.0mm · 0.60mm/px · z∈[-39,+111]mm · 2 of 27 slices shown (1 of 2)]
[im 1/27]
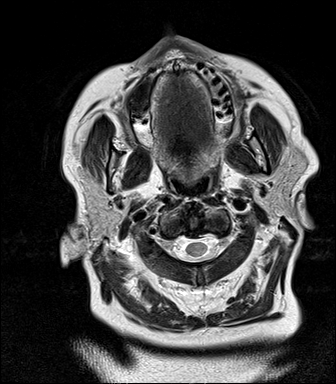
[im 27/27]
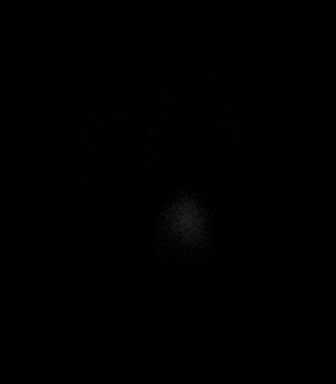

[Series 8: FLAIR · axial · 5.0mm · 0.45mm/px · z∈[-39,+111]mm · 2 of 27 slices shown]
[im 1/27]
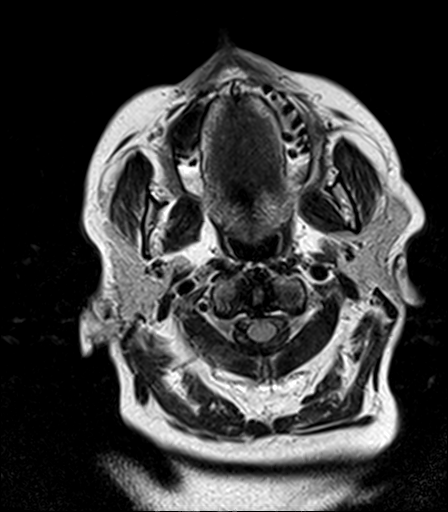
[im 27/27]
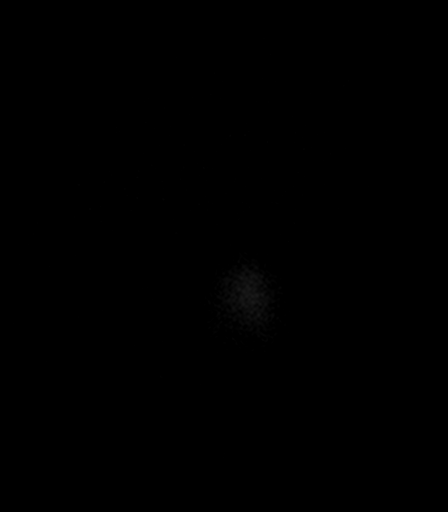

[Series 9: T2 · axial · 5.0mm · 0.45mm/px · z∈[-39,+111]mm · 2 of 27 slices shown (2 of 2)]
[im 1/27]
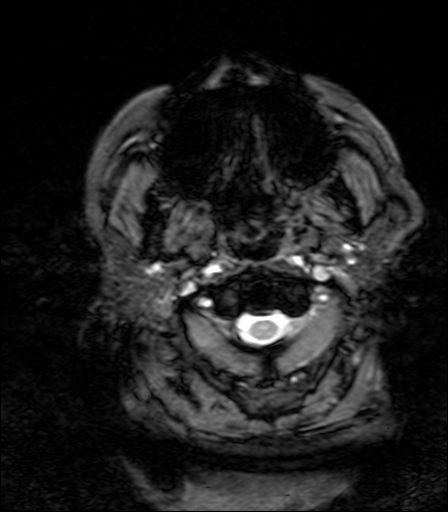
[im 27/27]
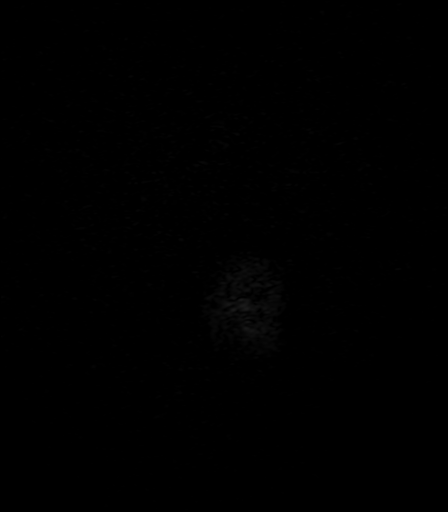

[Series 11: T2 post-contrast · coronal · 5.0mm · 0.49mm/px · 3 of 31 slices shown]
[im 1/31]
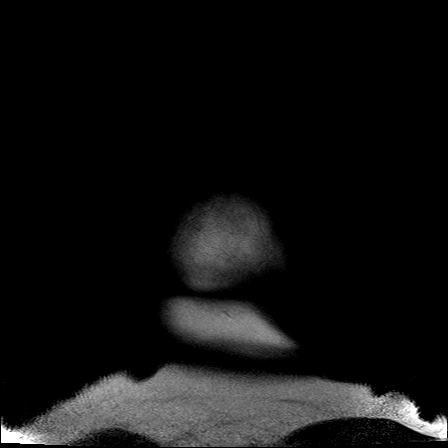
[im 16/31]
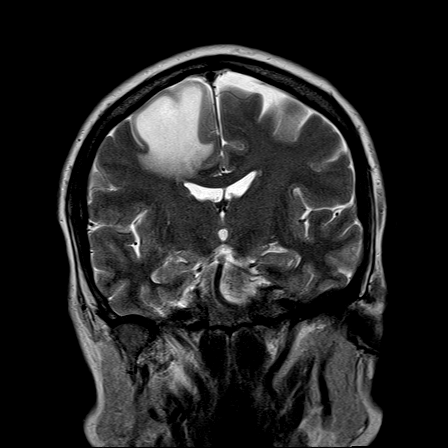
[im 31/31]
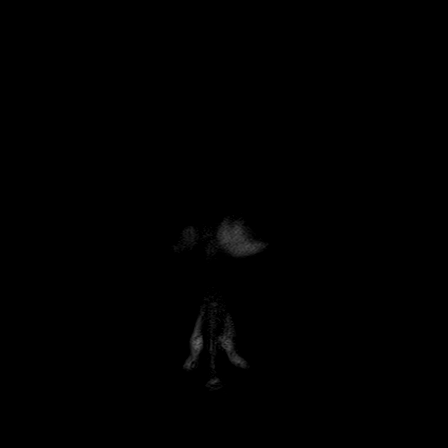

[Series 12: T1 post-contrast · axial · 3.0mm · 1.00mm/px · z∈[-36,+121]mm · 5 of 60 slices shown (1 of 3)]
[im 1/60]
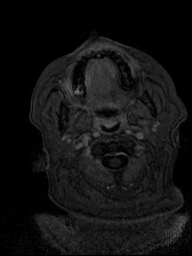
[im 15/60]
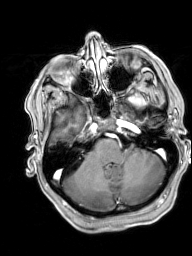
[im 30/60]
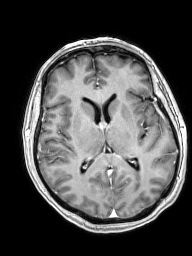
[im 45/60]
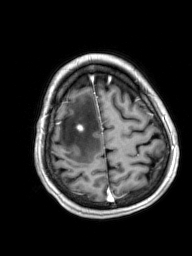
[im 60/60]
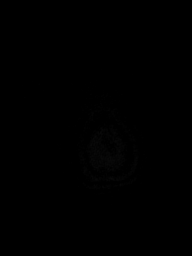

[Series 13: T1 post-contrast · coronal · 5.0mm · 0.43mm/px · 3 of 31 slices shown (2 of 3)]
[im 1/31]
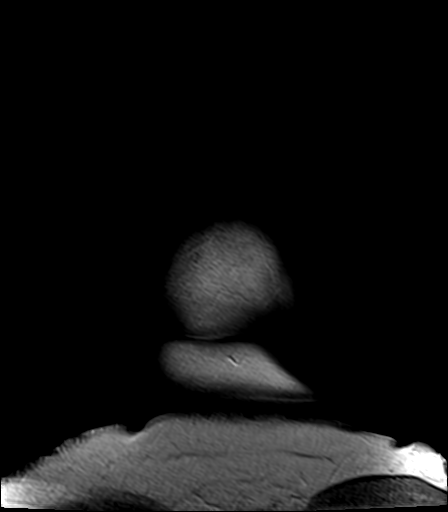
[im 16/31]
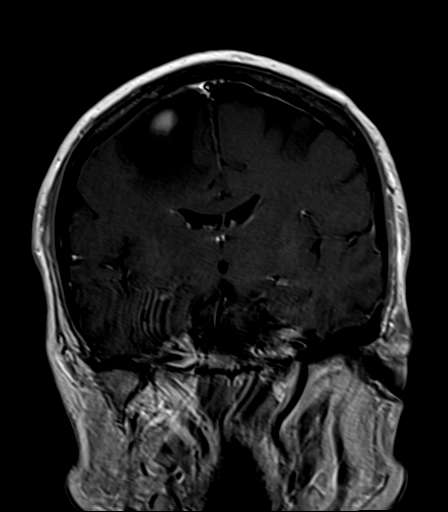
[im 31/31]
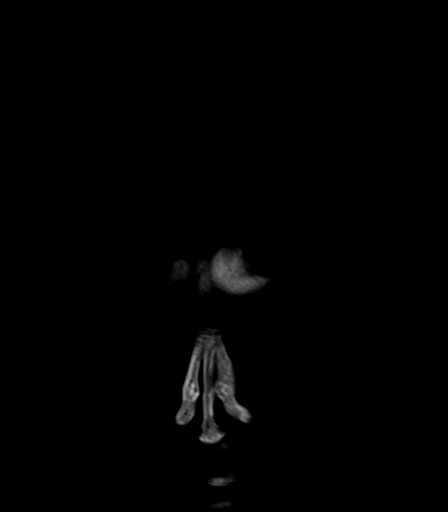

[Series 14: T1 post-contrast · sagittal · 5.0mm · 0.47mm/px · 3 of 29 slices shown (3 of 3)]
[im 1/29]
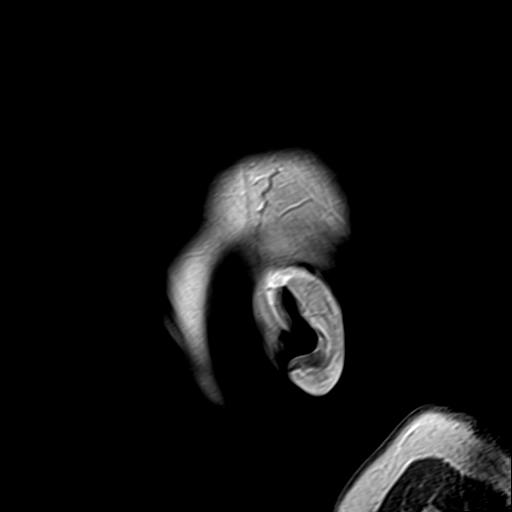
[im 15/29]
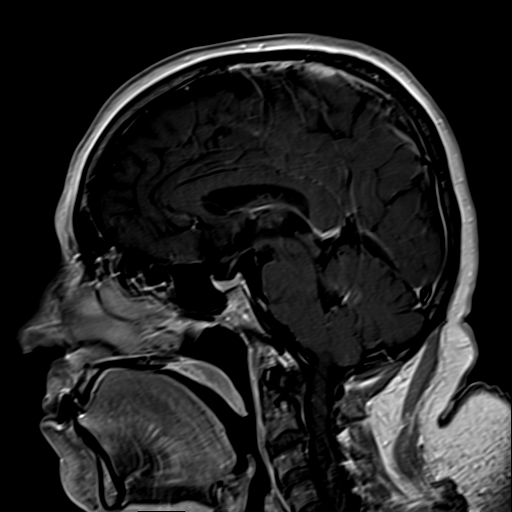
[im 29/29]
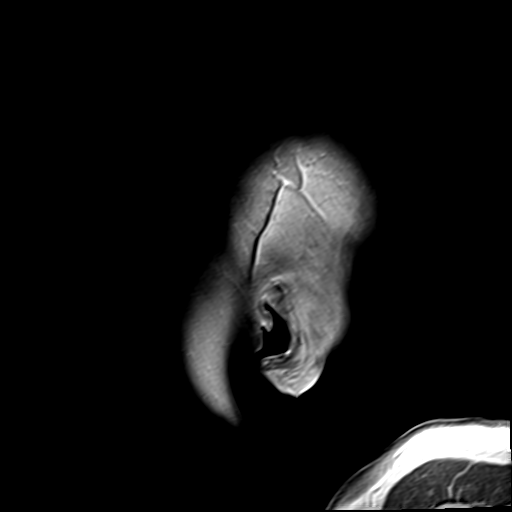

[Series 100: DWI · axial · 3.0mm · 1.80mm/px · z∈[-39,+109]mm · 5 of 58 slices shown (3 of 4)]
[im 1/58]
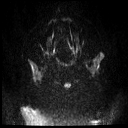
[im 15/58]
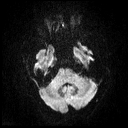
[im 29/58]
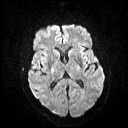
[im 43/58]
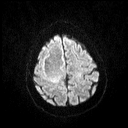
[im 58/58]
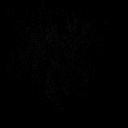

[Series 101: DWI · coronal · 3.0mm · 1.80mm/px · 4 of 49 slices shown (4 of 4)]
[im 1/49]
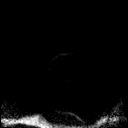
[im 17/49]
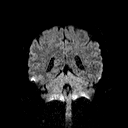
[im 33/49]
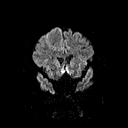
[im 49/49]
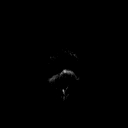

[42 of 48 positions shown; findings below may reference images not displayed]

FINDINGS: No acute stroke, hemorrhage, hydrocephalus, or extra-axial fluid.
Normal for age cerebral volume. No evidence for chronic
microvascular ischemic change.

As predicted from CT, there is a large area of vasogenic edema
associated with a RIGHT superior frontal mass. This mass measures 12
x 13 x 15 mm. This lesion shows slight central necrosis as well as
mild diffusion restriction, more peripheral than central. Only minor
right-to-left shift 2 mm. The imaging findings more likely represent
hypercellular tumor rather than infection although brain abscess
cannot completely be excluded. Multiple other similar lesions are
identified including medial RIGHT posterior parietal white matter
measuring approximately 5 mm (slight central necrosis), LEFT frontal
operculum measuring 2 mm, and LEFT periatrial white matter measuring
2 mm.

No osseous findings. No midline abnormality. Mild cervical
spondylosis. Flow voids are preserved. No significant sinus opacity
or air-fluid level. Negative orbits and mastoids. Major dural venous
sinuses are patent. No meningeal enhancement.
IMPRESSION: At least four enhancing intracranial lesions, the largest of which
is in the RIGHT superior frontal lobe, most consistent with
metastatic disease. See discussion above.

Marked vasogenic edema associated with the superior RIGHT frontal
lesion with 2 mm right-to-left shift.
